# Patient Record
Sex: Female | Born: 1955 | Race: White | Hispanic: No | Smoking: Never smoker
Health system: Southern US, Community
[De-identification: ages and names within clinical notes are randomized; demographics above are authoritative.]

## PROBLEM LIST (undated history)

## (undated) DIAGNOSIS — Z9889 Other specified postprocedural states: Secondary | ICD-10-CM

## (undated) DIAGNOSIS — R112 Nausea with vomiting, unspecified: Secondary | ICD-10-CM

## (undated) DIAGNOSIS — C801 Malignant (primary) neoplasm, unspecified: Secondary | ICD-10-CM

## (undated) DIAGNOSIS — D072 Carcinoma in situ of vagina: Secondary | ICD-10-CM

## (undated) DIAGNOSIS — F419 Anxiety disorder, unspecified: Secondary | ICD-10-CM

## (undated) DIAGNOSIS — Z973 Presence of spectacles and contact lenses: Secondary | ICD-10-CM

## (undated) DIAGNOSIS — K219 Gastro-esophageal reflux disease without esophagitis: Secondary | ICD-10-CM

## (undated) DIAGNOSIS — F32A Depression, unspecified: Secondary | ICD-10-CM

## (undated) DIAGNOSIS — Z8619 Personal history of other infectious and parasitic diseases: Secondary | ICD-10-CM

## (undated) DIAGNOSIS — I10 Essential (primary) hypertension: Secondary | ICD-10-CM

## (undated) DIAGNOSIS — B977 Papillomavirus as the cause of diseases classified elsewhere: Secondary | ICD-10-CM

## (undated) DIAGNOSIS — Z87410 Personal history of cervical dysplasia: Secondary | ICD-10-CM

## (undated) DIAGNOSIS — Z87411 Personal history of vaginal dysplasia: Secondary | ICD-10-CM

## (undated) DIAGNOSIS — M1990 Unspecified osteoarthritis, unspecified site: Secondary | ICD-10-CM

## (undated) DIAGNOSIS — F329 Major depressive disorder, single episode, unspecified: Secondary | ICD-10-CM

## (undated) DIAGNOSIS — T8859XA Other complications of anesthesia, initial encounter: Secondary | ICD-10-CM

## (undated) DIAGNOSIS — T4145XA Adverse effect of unspecified anesthetic, initial encounter: Secondary | ICD-10-CM

## (undated) HISTORY — PX: ABDOMINAL HYSTERECTOMY: SHX81

## (undated) HISTORY — PX: BREAST BIOPSY: SHX20

## (undated) HISTORY — PX: CERVICAL CONE BIOPSY: SUR198

## (undated) HISTORY — DX: Depression, unspecified: F32.A

## (undated) HISTORY — DX: Major depressive disorder, single episode, unspecified: F32.9

## (undated) HISTORY — DX: Anxiety disorder, unspecified: F41.9

## (undated) HISTORY — PX: EYE SURGERY: SHX253

---

## 1982-10-15 HISTORY — PX: LAPAROSCOPIC ASSISTED VAGINAL HYSTERECTOMY: SHX5398

## 2003-10-16 HISTORY — PX: OTHER SURGICAL HISTORY: SHX169

## 2008-01-01 ENCOUNTER — Other Ambulatory Visit: Admission: RE | Admit: 2008-01-01 | Discharge: 2008-01-01 | Payer: Self-pay | Admitting: Obstetrics & Gynecology

## 2009-04-11 ENCOUNTER — Encounter: Admission: RE | Admit: 2009-04-11 | Discharge: 2009-04-11 | Payer: Self-pay | Admitting: Obstetrics & Gynecology

## 2010-04-24 ENCOUNTER — Encounter: Admission: RE | Admit: 2010-04-24 | Discharge: 2010-04-24 | Payer: Self-pay | Admitting: Obstetrics & Gynecology

## 2011-04-16 ENCOUNTER — Other Ambulatory Visit: Payer: Self-pay | Admitting: Obstetrics & Gynecology

## 2011-04-16 DIAGNOSIS — Z1231 Encounter for screening mammogram for malignant neoplasm of breast: Secondary | ICD-10-CM

## 2011-04-26 ENCOUNTER — Ambulatory Visit: Payer: Self-pay

## 2011-05-22 ENCOUNTER — Ambulatory Visit
Admission: RE | Admit: 2011-05-22 | Discharge: 2011-05-22 | Disposition: A | Discharge status: Home / Self Care | Payer: PRIVATE HEALTH INSURANCE | Source: Ambulatory Visit | Attending: Obstetrics & Gynecology | Admitting: Obstetrics & Gynecology

## 2011-05-22 DIAGNOSIS — Z1231 Encounter for screening mammogram for malignant neoplasm of breast: Secondary | ICD-10-CM

## 2012-04-22 ENCOUNTER — Other Ambulatory Visit: Payer: Self-pay | Admitting: Obstetrics & Gynecology

## 2012-04-22 DIAGNOSIS — Z1231 Encounter for screening mammogram for malignant neoplasm of breast: Secondary | ICD-10-CM

## 2012-05-27 ENCOUNTER — Ambulatory Visit: Payer: PRIVATE HEALTH INSURANCE

## 2012-06-06 ENCOUNTER — Ambulatory Visit: Payer: PRIVATE HEALTH INSURANCE

## 2012-07-07 ENCOUNTER — Ambulatory Visit
Admission: RE | Admit: 2012-07-07 | Discharge: 2012-07-07 | Disposition: A | Discharge status: Home / Self Care | Payer: BC Managed Care – PPO | Source: Ambulatory Visit | Attending: Obstetrics & Gynecology | Admitting: Obstetrics & Gynecology

## 2012-07-07 DIAGNOSIS — Z1231 Encounter for screening mammogram for malignant neoplasm of breast: Secondary | ICD-10-CM

## 2012-07-07 LAB — HM PAP SMEAR

## 2013-03-25 ENCOUNTER — Ambulatory Visit (INDEPENDENT_AMBULATORY_CARE_PROVIDER_SITE_OTHER): Payer: Self-pay | Admitting: Nurse Practitioner

## 2013-03-25 ENCOUNTER — Encounter: Payer: Self-pay | Admitting: Nurse Practitioner

## 2013-03-25 VITALS — BP 120/66 | HR 74 | Resp 14 | Ht 64.5 in | Wt 133.2 lb

## 2013-03-25 DIAGNOSIS — A630 Anogenital (venereal) warts: Secondary | ICD-10-CM

## 2013-03-25 MED ORDER — IMIQUIMOD 5 % EX CREA: TOPICAL_CREAM | CUTANEOUS | Status: DC

## 2013-03-25 NOTE — Patient Instructions (Signed)
Genital Warts Genital warts are a sexually transmitted infection. They may appear as small bumps on the tissues of the genital area. CAUSES  Genital warts are caused by a virus called human papillomavirus (HPV). HPV is the most common sexually transmitted disease (STD) and infection of the sex organs. This infection is spread by having unprotected sex with an infected person. It can be spread by vaginal, anal, and oral sex. Many people do not know they are infected. They may be infected for years without problems. However, even if they do not have problems, they can unknowingly pass the infection to their sexual partners. SYMPTOMS   Itching and irritation in the genital area.  Warts that bleed.  Painful sexual intercourse. DIAGNOSIS  Warts are usually recognized with the naked eye on the vagina, vulva, perineum, anus, and rectum. Certain tests can also diagnose genital warts, such as:  A Pap test.  A tissue sample (biopsy) exam.  Colposcopy. A magnifying tool is used to examine the vagina and cervix. The HPV cells will change color when certain solutions are used. TREATMENT  Warts can be removed by:  Applying certain chemicals, such as cantharidin or podophyllin.  Liquid nitrogen freezing (cryotherapy).  Immunotherapy with candida or trichophyton injections.  Laser treatment.  Burning with an electrified probe (electrocautery).  Interferon injections.  Surgery. PREVENTION  HPV vaccination can help prevent HPV infections that cause genital warts and that cause cancer of the cervix. It is recommended that the vaccination be given to people between the ages 9 to 26 years old. The vaccine might not work as well or might not work at all if you already have HPV. It should not be given to pregnant women. HOME CARE INSTRUCTIONS   It is important to follow your caregiver's instructions. The warts will not go away without treatment. Repeat treatments are often needed to get rid of warts.  Even after it appears that the warts are gone, the normal tissue underneath often remains infected.  Do not try to treat genital warts with medicine used to treat hand warts. This type of medicine is strong and can burn the skin in the genital area, causing more damage.  Tell your past and current sexual partner(s) that you have genital warts. They may be infected also and need treatment.  Avoid sexual contact while being treated.  Do not touch or scratch the warts. The infection may spread to other parts of your body.  Women with genital warts should have a cervical cancer check (Pap test) at least once a year. This type of cancer is slow-growing and can be cured if found early. Chances of developing cervical cancer are increased with HPV.  Inform your obstetrician about your warts in the event of pregnancy. This virus can be passed to the baby's respiratory tract. Discuss this with your caregiver.  Use a condom during sexual intercourse. Following treatment, the use of condoms will help prevent reinfection.  Ask your caregiver about using over-the-counter anti-itch creams. SEEK MEDICAL CARE IF:   Your treated skin becomes red, swollen, or painful.  You have a fever.  You feel generally ill.  You feel little lumps in and around your genital area.  You are bleeding or have painful sexual intercourse. MAKE SURE YOU:   Understand these instructions.  Will watch your condition.  Will get help right away if you are not doing well or get worse. Document Released: 09/28/2000 Document Revised: 12/24/2011 Document Reviewed: 04/09/2011 Exit[employer] Patient Information 2014 [employer].  

## 2013-03-25 NOTE — Progress Notes (Signed)
Subjective:     Patient ID: Patricia Gentry, female   DOB: 12/30/1955, 57 y.o.   MRN: [id number]  HPI 57 yo WM Fe presents with history of condyloma and had laser treatment in the past .This episode flared in September after a bad job situation that was stress induced.  They have now grown and areas are more [place] .She would like to have them removed.  Last time done  by Dr. Hyacinth Meeker - 7/ 2009.  She has no other urinary of vaginal symptoms.  Husband is without sign or symptoms.  She did use Aldara in the past and is willing to try again.  She leaves for [place] in the morning for her job.  Review of Systems  Constitutional: Negative.   Respiratory: Negative.   Cardiovascular: Negative.   Gastrointestinal: Negative.   Genitourinary: Negative.   Musculoskeletal: Negative.   Skin: Negative.   Neurological: Negative.   Psychiatric/Behavioral: Negative.        Objective:   Physical Exam  Constitutional: She is oriented to person, place, and time. She appears well-developed and well-nourished.  Abdominal: Soft. She exhibits no distension and no mass. There is no tenderness. There is no rebound and no guarding.  Genitourinary:     Condyloma are on left side with the largest size at the groin at 4-5 mm. Smaller cluster at the bottom with 3-4 lesions. No vaginal discharge and no internal vaginal lesions.  Neurological: She is alert and oriented to person, place, and time.  Psychiatric: She has a normal mood and affect. Her behavior is normal. Judgment and thought content normal.       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 Hyacinth Meeker in 3-4 weeks for recheck and discuss removal or laser at that time. Call back if symptoms worsens

## 2013-03-26 NOTE — Progress Notes (Signed)
Reviewed personally.  M. Suzanne Alin Hutchins, MD.  

## 2013-04-10 ENCOUNTER — Encounter: Payer: Self-pay | Admitting: Obstetrics & Gynecology

## 2013-04-15 ENCOUNTER — Ambulatory Visit: Payer: Self-pay | Admitting: Obstetrics & Gynecology

## 2013-04-16 ENCOUNTER — Ambulatory Visit (INDEPENDENT_AMBULATORY_CARE_PROVIDER_SITE_OTHER): Payer: Self-pay | Admitting: Obstetrics & Gynecology

## 2013-04-16 ENCOUNTER — Encounter: Payer: Self-pay | Admitting: Obstetrics & Gynecology

## 2013-04-16 VITALS — BP 138/84 | HR 64 | Resp 16 | Ht 64.14 in | Wt 127.0 lb

## 2013-04-16 DIAGNOSIS — A630 Anogenital (venereal) warts: Secondary | ICD-10-CM

## 2013-04-16 NOTE — Patient Instructions (Signed)
Vulvar Lesion Excision Post-procedure Instructions   You may take Ibuprofen, Aleve, or Tylenol for any pain or discomfort.   You may have a small amount of spotting.  You should wear a mini pad for the next few days.   You may use some topical Neosporin ointment (over the counter) if you would like.     You will need to call the office if you have redness around the biopsy site, if there is any unusual drainage, if the bleeding is heavy, or if you have any concerns.     Shower or bathe as normal   You will be notified within one week of your biopsy results or we will discuss your results at your follow-up appointment if needed.

## 2013-04-16 NOTE — Progress Notes (Signed)
Subjective:     Patient ID: Patricia Gentry, female   DOB: [birth year], 57 y.o.   MRN: [id number]  HPI 57 y.o.DWF here for discussion of treatment of vulvar condyloma.  Saw Shirlyn Goltz who suggested she might need laser ablation due to volume of condyloma.  Recently divorced.  Has a new job.  Very satisfied with life changes.  Has been on Aldara which has made them smaller but not resolved them.  She just wants treatment.    Review of Systems  All other systems reviewed and are negative.       Objective:   Physical Exam  Genitourinary:     Lymphadenopathy:       Right: No inguinal adenopathy present.       Left: No inguinal adenopathy present.   Reviewed findings with patient.  Recommended excision of 3 larger lesions and cryo ablation to smaller lesions.  Verbal consent obtained.  Risks of bleeding, infection, need for future treatment, and other options (doing nothing, OR laser treatment) discussed.   Areas for excision cleansed with Betadine x 3.  Small amount of 1% Lidocaine instilled beneath lesions.  Lesions excised with shave biopsy technique.  Silver nitrate used for adequate hemostasis.  Patchy area of smaller condyloma treated with Verruca Freeze with freeze-thaw-freeze technique.  Patient tolerated procedure well.  Dressings applied.      Assessment:     Condyloma, s/p excision and cryo today     Plan:     Patient will call in one month and give me update.  If not completely gone, will plan to treat at [facility] in September.  Additional Cryo treatment may be necessary.   No pathology sent.

## 2013-06-18 ENCOUNTER — Other Ambulatory Visit: Payer: Self-pay

## 2013-06-18 DIAGNOSIS — Z1231 Encounter for screening mammogram for malignant neoplasm of breast: Secondary | ICD-10-CM

## 2013-07-08 ENCOUNTER — Ambulatory Visit
Admission: RE | Admit: 2013-07-08 | Discharge: 2013-07-08 | Disposition: A | Discharge status: Home / Self Care | Payer: BC Managed Care – PPO | Source: Ambulatory Visit

## 2013-07-08 DIAGNOSIS — Z1231 Encounter for screening mammogram for malignant neoplasm of breast: Secondary | ICD-10-CM

## 2013-07-09 ENCOUNTER — Ambulatory Visit: Payer: Self-pay | Admitting: Nurse Practitioner

## 2013-07-13 ENCOUNTER — Encounter: Payer: Self-pay | Admitting: Obstetrics & Gynecology

## 2013-07-13 ENCOUNTER — Ambulatory Visit: Payer: Self-pay | Admitting: Obstetrics & Gynecology

## 2013-07-28 ENCOUNTER — Ambulatory Visit (INDEPENDENT_AMBULATORY_CARE_PROVIDER_SITE_OTHER): Payer: BC Managed Care – PPO | Admitting: Obstetrics & Gynecology

## 2013-07-28 ENCOUNTER — Encounter: Payer: Self-pay | Admitting: Obstetrics & Gynecology

## 2013-07-28 VITALS — BP 136/84 | HR 64 | Resp 16 | Ht 63.75 in | Wt 123.2 lb

## 2013-07-28 DIAGNOSIS — Z124 Encounter for screening for malignant neoplasm of cervix: Secondary | ICD-10-CM

## 2013-07-28 DIAGNOSIS — R6889 Other general symptoms and signs: Secondary | ICD-10-CM

## 2013-07-28 DIAGNOSIS — Z01419 Encounter for gynecological examination (general) (routine) without abnormal findings: Secondary | ICD-10-CM

## 2013-07-28 MED ORDER — ESCITALOPRAM OXALATE 10 MG PO TABS: 10.0000 mg | ORAL_TABLET | Freq: Every day | ORAL | Status: DC

## 2013-07-28 MED ORDER — ESTRADIOL 0.5 MG PO TABS: 0.5000 mg | ORAL_TABLET | Freq: Every day | ORAL | Status: DC

## 2013-07-28 NOTE — Patient Instructions (Signed)

## 2013-07-28 NOTE — Progress Notes (Addendum)
57 y.o. G0P0 SeparatedCaucasianF here for annual exam.  Work is very stressful.  Just got back from [place].  She did not like the city at all.  Brother also had 2 strokes this last month.  He is younger than her.  Has lost weight due to stress in life.  Reports being stable at 122-124 for several months.   Patient's last menstrual period was 10/15/1992.          Sexually active: yes  The current method of family planning is status post hysterectomy.    Exercising: yes  tae bo Smoker:  no  Health Maintenance: Pap:  07/07/12 WNL History of abnormal Pap:  yes MMG:  07/08/13 normal Colonoscopy:12/18/12, Dr. Samuel Bouche at [facility], 1 adenoma, repeat 5 years (report came 08/06/13 and scanned in EPIC) BMD:   2002 TDaP:  04/24/10 Screening Labs: PCP, Hb today: PCP, Urine today: PCP   reports that she has never smoked. She has never used smokeless tobacco. She reports that she drinks about 1.5 ounces of alcohol per week. She reports that she does not use illicit drugs.  Past Medical History  Diagnosis Date  . Other abnormal Papanicolaou smear of vagina and vaginal HPV 08/2004    History of VAIN II - treated with laser  . 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 Laparoscopic assisted vaginal hysterectomy  late 1990's    and BSO    Current Outpatient Prescriptions  Medication Sig Dispense Refill  . Biotin 1 MG CAPS Take by mouth.      . calcium carbonate 200 MG capsule Take 250 mg by mouth 2 (two) times daily with a meal.      . estradiol (ESTRACE) 0.5 MG tablet Take 0.5 mg by mouth daily.      . Multiple Vitamin (MULTIVITAMIN) tablet Take 1 tablet by mouth daily.      . Omega-3 Fatty Acids (FISH OIL PO) Take by mouth.      Marland Kitchen atorvastatin (LIPITOR) 10 MG tablet Take 10 mg by mouth daily.      . imiquimod (ALDARA) 5 % cream Apply topically 3 (three) times a week. Apply entire packet of cream to affected areas 3 times weekly.  Apply  at night and leave on during sleeping hours.  Wash off completely after 8 hours.  12 each  0   No current facility-administered medications for this visit.    Family History  Problem Relation Age of Onset  . Hypertension Mother   . Heart disease Mother   . Hypertension Father   . Heart disease Father   . Hypertension Brother   . Heart disease Brother   . Stroke Brother     ROS:  Pertinent items are noted in HPI.  Otherwise, a comprehensive ROS was negative.  Exam:   BP 136/84  Pulse 64  Resp 16  Ht 5' 3.75" (1.619 m)  Wt 123 lb 3.2 oz (55.883 kg)  BMI 21.32 kg/m2  LMP 10/15/1992  Weight change: -18lbs   Height: 5' 3.75" (161.9 cm)  Ht Readings from Last 3 Encounters:  07/28/13 5' 3.75" (1.619 m)  04/16/13 5' 4.14" (1.629 m)  03/25/13 5' 4.5" (1.638 m)    General appearance: alert, cooperative and appears stated age Head: Normocephalic, without obvious abnormality, atraumatic Neck: no adenopathy, supple, symmetrical, trachea midline and thyroid normal to inspection and palpation Lungs: clear to auscultation bilaterally Breasts: normal appearance, no masses or tenderness Heart:  regular rate and rhythm Abdomen: soft, non-tender; bowel sounds normal; no masses,  no organomegaly Extremities: extremities normal, atraumatic, no cyanosis or edema Skin: Skin color, texture, turgor normal. No rashes or lesions Lymph nodes: Cervical, supraclavicular, and axillary nodes normal. No abnormal inguinal nodes palpated Neurologic: Grossly normal   Pelvic: External genitalia:  no lesions              Urethra:  normal appearing urethra with no masses, tenderness or lesions              Bartholins and Skenes: normal                 Vagina: normal appearing vagina with normal color and discharge, no lesions              Cervix: absent              Pap taken: yes Bimanual Exam:  Uterus:  uterus absent              Adnexa: no mass, fullness, tenderness               Rectovaginal:  Confirms               Anus:  normal sphincter tone, no lesions  A:  Well Woman with normal exam H/O LAVH/BSO H/O VAIN 2 Stressors  P:   mammogram pap smear today Escitalopram 10mg  daily.  Rx to pharmacy. Estrace 0.5mg  daily.  #90/4RF. Release of colonoscopy. return annually or prn  An After Visit Summary was printed and given to the patient.

## 2013-07-30 LAB — IPS PAP SMEAR ONLY

## 2013-08-05 NOTE — Addendum Note (Signed)
Addended by: Jerene Bears on: 08/05/2013 09:45 AM   Modules accepted: Orders

## 2013-09-15 ENCOUNTER — Ambulatory Visit (HOSPITAL_COMMUNITY)
Admission: RE | Admit: 2013-09-15 | Discharge: 2013-09-15 | Disposition: A | Discharge status: Home / Self Care | Payer: BC Managed Care – PPO | Source: Ambulatory Visit | Attending: Physician Assistant | Admitting: Physician Assistant

## 2013-09-15 ENCOUNTER — Ambulatory Visit (INDEPENDENT_AMBULATORY_CARE_PROVIDER_SITE_OTHER): Payer: BC Managed Care – PPO | Admitting: Physician Assistant

## 2013-09-15 VITALS — BP 128/82 | HR 80 | Temp 98.0°F | Resp 17 | Ht 64.0 in | Wt 129.0 lb

## 2013-09-15 DIAGNOSIS — M79609 Pain in unspecified limb: Secondary | ICD-10-CM | POA: Insufficient documentation

## 2013-09-15 DIAGNOSIS — M79661 Pain in right lower leg: Secondary | ICD-10-CM

## 2013-09-15 DIAGNOSIS — M7989 Other specified soft tissue disorders: Secondary | ICD-10-CM

## 2013-09-15 NOTE — Progress Notes (Signed)
Subjective:    Patient ID: Patricia Gentry, female    DOB: 1956-02-24, 57 y.o.   MRN: [id number]  HPI   [name] is a very pleasant 57 yr old female here with concern for swelling and pain in her right leg.  Reports a couple weeks ago she "pinched a nerve" in her calf - had pain and limped around for about 2 days, but it resolved on its own.  Yesterday she stepped down wrong and felt an immediate pull in the right calf.  Felt a knot in the right calf.  Pain and swelling all the way down to her ankle and foot. Some numbness in the right lower extremity.  Also feels cold.  Propped the leg up all day yesterday, no relief of swelling.  Aleve this AM with no relief of pain. Pain is primarily in the medial aspect the right calf.    No personal or family history of clot.  Never smoker.  No hx arrythmia.  Younger brother with 2 strokes recently.  Youngest brother with arrhythmia - shocked twice, medication.  No recent long travel.  Takes 0.5mg  estradiol daily.   Review of Systems  Constitutional: Negative for fever and chills.  Respiratory: Negative for cough, shortness of breath and wheezing.   Cardiovascular: Negative for chest pain, palpitations and leg swelling.  Gastrointestinal: Negative.   Musculoskeletal: Positive for joint swelling (right ankle) and myalgias (RLE).  Skin: Negative for color change, pallor and wound.  Neurological: Positive for numbness (RLE).       Objective:   Physical Exam  Vitals reviewed. Constitutional: She is oriented to person, place, and time. She appears well-developed and well-nourished. No distress.  HENT:  Head: Normocephalic and atraumatic.  Eyes: Conjunctivae are normal. No scleral icterus.  Cardiovascular: Normal rate, regular rhythm and normal heart sounds.   Pulses:      Dorsalis pedis pulses are 1+ on the right side, and 1+ on the left side.       Posterior tibial pulses are 1+ on the right side, and 1+ on the left side.  Pulmonary/Chest: Effort  normal and breath sounds normal. She has no wheezes. She has no rales.  Musculoskeletal:       Right knee: Normal.       Left knee: Normal.       Right ankle: She exhibits swelling. She exhibits normal range of motion, no ecchymosis and no deformity.       Left ankle: Normal.       Right lower leg: She exhibits tenderness. She exhibits no swelling.       Left lower leg: Normal.       Right foot: She exhibits normal range of motion, no tenderness, no swelling and normal capillary refill.       Left foot: Normal.  Tenderness of right calf over great saphenous vein; minimal if any swelling; 1+ pitting edema of right ankle; no edema left ankle; +Homan's sign; no erythema, warmth; no ecchymosis; pulses 1+ bilaterally; feet/toes warm bilaterally; normal cap refill  Neurological: She is alert and oriented to person, place, and time. She has normal strength.  Skin: Skin is warm and dry.  Psychiatric: She has a normal mood and affect. Her behavior is normal.    R calf 32cm, L calf 32cm R ankle 23cm, L ankle 23cm  Well's Criteria = 2 (moderate probability)    Assessment & Plan:  Right calf pain - Plan: Lower Extremity Venous Duplex Right  Right leg  swelling - Plan: Lower Extremity Venous Duplex Right   [name] is a very pleasant 57 yr old female here with onset of right calf pain yesterday.  She recalls stepping wrong when the pain began, and thinks this is probably just a pulled muscle.  I agree that this could be MSK, but feel that we need to r/o DVT.  Will send pt to [facility] for U/S.  Addendum: Negative for DVT.  Will treat conservatively with rest, ice, elevated.  RTC 48 hours for recheck, sooner if worse.   Loleta Dicker MHS, PA-C [facility] 12/2/20149:33 PM

## 2013-09-15 NOTE — Patient Instructions (Signed)
You can go today, now for the doppler study. You can go to [facility], new north tower entrance A entrance, admitting, you can use [employer] if you can not find a parking spot, this is free

## 2013-09-15 NOTE — Progress Notes (Signed)
Right lower extremity venous duplex completed.  Right:  No evidence of DVT, superficial thrombosis, or Baker's cyst.  Left:  Negative for DVT in the common femoral vein.  

## 2014-05-20 ENCOUNTER — Telehealth: Payer: Self-pay | Admitting: Obstetrics & Gynecology

## 2014-05-20 DIAGNOSIS — L918 Other hypertrophic disorders of the skin: Secondary | ICD-10-CM

## 2014-05-20 NOTE — Telephone Encounter (Signed)
Spoke with patient. Patient would like to schedule skin tag removal at this time. Appointment schedule for August 28th at [place] with Dr.Miller. Patient agreeable to date and time. Order placed for precert.  Cc: Felipa Emory  Routing to provider for final review. Patient agreeable to disposition. Will close encounter

## 2014-05-20 NOTE — Telephone Encounter (Signed)
Patient said she has a skin tag on her thigh close to her panty line that dr Sabra Heck told her she would remove pt wants to schedule appt to have that done.

## 2014-05-24 ENCOUNTER — Telehealth: Payer: Self-pay | Admitting: Obstetrics & Gynecology

## 2014-05-24 NOTE — Telephone Encounter (Signed)
Per [employer], patient coverage has termed. Contacted patient to ask for updated insurance information. She states that she will "pay out of pocket" when she comes in for procedure 08/28. States that new coverage should have began, but it has not yet. If it begins prior to appt 08/28, she will fax it in.

## 2014-05-24 NOTE — Telephone Encounter (Signed)
Thank you :)

## 2014-06-11 ENCOUNTER — Ambulatory Visit (INDEPENDENT_AMBULATORY_CARE_PROVIDER_SITE_OTHER): Payer: PRIVATE HEALTH INSURANCE | Admitting: Obstetrics & Gynecology

## 2014-06-11 VITALS — BP 128/80 | HR 64 | Resp 16 | Ht 63.75 in | Wt 127.4 lb

## 2014-06-11 DIAGNOSIS — L919 Hypertrophic disorder of the skin, unspecified: Secondary | ICD-10-CM

## 2014-06-11 DIAGNOSIS — A630 Anogenital (venereal) warts: Secondary | ICD-10-CM

## 2014-06-11 DIAGNOSIS — L909 Atrophic disorder of skin, unspecified: Secondary | ICD-10-CM

## 2014-06-11 DIAGNOSIS — L918 Other hypertrophic disorders of the skin: Secondary | ICD-10-CM

## 2014-06-11 NOTE — Progress Notes (Addendum)
Subjective:     Patient ID: Patricia Gentry, female   DOB: August 11, 1956, 58 y.o.   MRN: [id number]  HPI Very nice 58 yo G0P0 Sep WF here for removal of what she believes are skin tags.  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  Has driven more than 409 miles a day several different times for work.  Review of Systems  All other systems reviewed and are negative.      Objective:   Physical Exam  Constitutional: She is oriented to person, place, and time. She appears well-developed and well-nourished.  Genitourinary:     Neurological: She is alert and oriented to person, place, and time.  Skin: Skin is warm and dry.  Psychiatric: She has a normal mood and affect.   Consent obtained for excision of lesions.  Sterile technique used.  Area cleansed with Betadine.  36mm of 1.0% Lidocaine used to anesthetized the skin.  Using sterile scissors and pick-ups, the lesions were lifted and excised.  Silver nitrate used for excellent hemostasis.  No pathology sent as pt has had prior genital wart lesions removed in the past.  Pt tolerated procedure well.  Dressing applied.       Assessment:     Excision of vulvar warts, largest lesion about 76mm in size    Plan:     Care instructions provided.  Pt to call if has any evidence of infection.  O/w return for routine care this fall.

## 2014-07-29 ENCOUNTER — Other Ambulatory Visit: Payer: Self-pay

## 2014-07-29 MED ORDER — ESTRADIOL 0.5 MG PO TABS: 0.5000 mg | ORAL_TABLET | Freq: Every day | ORAL | Status: DC

## 2014-07-29 MED ORDER — ESCITALOPRAM OXALATE 10 MG PO TABS: 10.0000 mg | ORAL_TABLET | Freq: Every day | ORAL | Status: DC

## 2014-07-29 NOTE — Telephone Encounter (Signed)
Incoming Fax from [facility] Last AEX: 07/28/13 Last refill:Lexapro-07/28/13 #30 X 12, Estrace- 07/28/13 #90 X 4 Current AEX:08/19/14 Last MMG: 07/08/13 Bi-Rads Neg  Please advise

## 2014-08-06 ENCOUNTER — Other Ambulatory Visit: Payer: Self-pay

## 2014-08-06 DIAGNOSIS — Z1231 Encounter for screening mammogram for malignant neoplasm of breast: Secondary | ICD-10-CM

## 2014-08-19 ENCOUNTER — Ambulatory Visit (INDEPENDENT_AMBULATORY_CARE_PROVIDER_SITE_OTHER): Payer: PRIVATE HEALTH INSURANCE | Admitting: Obstetrics & Gynecology

## 2014-08-19 ENCOUNTER — Encounter: Payer: Self-pay | Admitting: Obstetrics & Gynecology

## 2014-08-19 DIAGNOSIS — Z124 Encounter for screening for malignant neoplasm of cervix: Secondary | ICD-10-CM

## 2014-08-19 DIAGNOSIS — Z01419 Encounter for gynecological examination (general) (routine) without abnormal findings: Secondary | ICD-10-CM

## 2014-08-19 MED ORDER — ESTRADIOL 0.5 MG PO TABS: 0.5000 mg | ORAL_TABLET | Freq: Every day | ORAL | Status: DC

## 2014-08-19 MED ORDER — ESCITALOPRAM OXALATE 10 MG PO TABS: 10.0000 mg | ORAL_TABLET | Freq: Every day | ORAL | Status: DC

## 2014-08-19 NOTE — Progress Notes (Signed)
58 y.o. G0P0 Legally SeparatedCaucasianF here for annual exam.  No vaginal bleeding.  Doing well.  Dating a new person the last three to four weeks.   Patient's last menstrual period was 10/15/1992.          Sexually active: Yes.    The current method of family planning is status post hysterectomy.    Exercising: Yes.    walking Smoker:  no  Health Maintenance: Pap:  07/28/13 ASCUS/negative HR HPV History of abnormal Pap:  Yes h/o VAIN II/laser of vaginal cuff MMG:  07/08/13-normal Colonoscopy:  12/28/12-repeat in 5 years-Dr Linton Rump 1 adenoma BMD:   2002 TDaP:  04/24/10 Screening Labs: PCP, Hb today: PCP, Urine today: PCP   reports that she has never smoked. She has never used smokeless tobacco. She reports that she drinks alcohol. She reports that she does not use illicit drugs.  Past Medical History  Diagnosis Date  . Other abnormal Papanicolaou smear of vagina and vaginal HPV 08/2004    History of VAIN II - treated with laser  . 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 Anxiety   . Depression     Past Surgical History  Procedure Laterality Date  . Laparoscopic assisted vaginal hysterectomy  late 1990's    and BSO  . Abdominal hysterectomy      Current Outpatient Prescriptions  Medication Sig Dispense Refill  . Biotin 1 MG CAPS Take by mouth.    . calcium carbonate 200 MG capsule Take 250 mg by mouth 2 (two) times daily with a meal.    . escitalopram (LEXAPRO) 10 MG tablet Take 1 tablet (10 mg total) by mouth daily. 30 tablet 0  . estradiol (ESTRACE) 0.5 MG tablet Take 1 tablet (0.5 mg total) by mouth daily. 30 tablet 0  . Multiple Vitamin (MULTIVITAMIN) tablet Take 1 tablet by mouth daily.    . Omega-3 Fatty Acids (FISH OIL PO) Take by mouth.     No current facility-administered medications for this visit.    Family History  Problem Relation Age of Onset  . Hypertension Mother   . Heart disease Mother   . Hypertension Father   . Heart disease Father   . Hypertension  Brother   . Heart disease Brother   . Stroke Brother   . Diabetes Brother     younger brother    ROS:  Pertinent items are noted in HPI.  Otherwise, a comprehensive ROS was negative.  Exam:   General appearance: alert, cooperative and appears stated age Head: Normocephalic, without obvious abnormality, atraumatic Neck: no adenopathy, supple, symmetrical, trachea midline and thyroid normal to inspection and palpation Lungs: clear to auscultation bilaterally Breasts: normal appearance, no masses or tenderness Heart: regular rate and rhythm Abdomen: soft, non-tender; bowel sounds normal; no masses,  no organomegaly Extremities: extremities normal, atraumatic, no cyanosis or edema Skin: Skin color, texture, turgor normal. No rashes or lesions Lymph nodes: Cervical, supraclavicular, and axillary nodes normal. No abnormal inguinal nodes palpated Neurologic: Grossly normal   Pelvic: External genitalia:  no lesions              Urethra:  normal appearing urethra with no masses, tenderness or lesions              Bartholins and Skenes: normal                 Vagina: normal appearing vagina with normal color and discharge, no lesions  Cervix: absent              Pap taken: Yes.   Bimanual Exam:  Uterus:  uterus absent              Adnexa: no mass, fullness, tenderness               Rectovaginal: Confirms               Anus:  normal sphincter tone, no lesions  A:  Well Woman with normal exam H/O LAVH/BSO (Dr. Laverta Baltimore in [place], 1990) H/O VAIN 2 with h/o laser to vaginal cuff 11/05  P: Mammogram yearly pap smear today Escitalopram 10mg  daily. Rx to pharmacy. Estrace 0.5mg  daily. #90/4RF. return annually or prn  An After Visit Summary was printed and given to the patient.

## 2014-08-23 LAB — IPS PAP TEST WITH REFLEX TO HPV

## 2015-03-10 ENCOUNTER — Ambulatory Visit
Admission: RE | Admit: 2015-03-10 | Discharge: 2015-03-10 | Disposition: A | Discharge status: Home / Self Care | Payer: PRIVATE HEALTH INSURANCE | Source: Ambulatory Visit

## 2015-03-10 DIAGNOSIS — Z1231 Encounter for screening mammogram for malignant neoplasm of breast: Secondary | ICD-10-CM

## 2015-03-11 ENCOUNTER — Other Ambulatory Visit: Payer: Self-pay | Admitting: Obstetrics & Gynecology

## 2015-03-11 DIAGNOSIS — R928 Other abnormal and inconclusive findings on diagnostic imaging of breast: Secondary | ICD-10-CM

## 2015-03-16 ENCOUNTER — Ambulatory Visit
Admission: RE | Admit: 2015-03-16 | Discharge: 2015-03-16 | Disposition: A | Discharge status: Home / Self Care | Payer: PRIVATE HEALTH INSURANCE | Source: Ambulatory Visit | Attending: Obstetrics & Gynecology | Admitting: Obstetrics & Gynecology

## 2015-03-16 DIAGNOSIS — R928 Other abnormal and inconclusive findings on diagnostic imaging of breast: Secondary | ICD-10-CM

## 2015-08-24 ENCOUNTER — Other Ambulatory Visit: Payer: Self-pay | Admitting: *Deleted

## 2015-08-24 NOTE — Telephone Encounter (Signed)
Medication refill request: lexapro  Last AEX:  08/19/14 SM Next AEX: 09/02/15 SM Last MMG (if hormonal medication request): 03/16/15 [id number]:neg Refill authorized: 08/19/14 #[id number]. Today please advise.

## 2015-08-25 MED ORDER — ESCITALOPRAM OXALATE 10 MG PO TABS: 10.0000 mg | ORAL_TABLET | Freq: Every day | ORAL | Status: DC

## 2015-09-02 ENCOUNTER — Ambulatory Visit (INDEPENDENT_AMBULATORY_CARE_PROVIDER_SITE_OTHER): Payer: PRIVATE HEALTH INSURANCE | Admitting: Obstetrics & Gynecology

## 2015-09-02 ENCOUNTER — Encounter: Payer: Self-pay | Admitting: Obstetrics & Gynecology

## 2015-09-02 VITALS — BP 140/84 | HR 68 | Resp 16 | Ht 63.5 in | Wt 132.0 lb

## 2015-09-02 DIAGNOSIS — Z0000 Encounter for general adult medical examination without abnormal findings: Secondary | ICD-10-CM

## 2015-09-02 DIAGNOSIS — Z01419 Encounter for gynecological examination (general) (routine) without abnormal findings: Secondary | ICD-10-CM | POA: Diagnosis not present

## 2015-09-02 DIAGNOSIS — A630 Anogenital (venereal) warts: Secondary | ICD-10-CM | POA: Diagnosis not present

## 2015-09-02 DIAGNOSIS — Z79890 Hormone replacement therapy (postmenopausal): Secondary | ICD-10-CM

## 2015-09-02 DIAGNOSIS — Z124 Encounter for screening for malignant neoplasm of cervix: Secondary | ICD-10-CM | POA: Diagnosis not present

## 2015-09-02 DIAGNOSIS — N891 Moderate vaginal dysplasia: Secondary | ICD-10-CM

## 2015-09-02 LAB — CBC
HEMATOCRIT: 43.7 % (ref 36.0–46.0)
HEMOGLOBIN: 14.8 g/dL (ref 12.0–15.0)
MCH: 31.4 pg (ref 26.0–34.0)
MCHC: 33.9 g/dL (ref 30.0–36.0)
MCV: 92.6 fL (ref 78.0–100.0)
MPV: 10.5 fL (ref 8.6–12.4)
Platelets: 261 10*3/uL (ref 150–400)
RBC: 4.72 MIL/uL (ref 3.87–5.11)
RDW: 13.1 % (ref 11.5–15.5)
WBC: 5.1 10*3/uL (ref 4.0–10.5)

## 2015-09-02 LAB — COMPREHENSIVE METABOLIC PANEL
ALK PHOS: 81 U/L (ref 33–130)
ALT: 68 U/L — AB (ref 6–29)
AST: 45 U/L — AB (ref 10–35)
Albumin: 4.8 g/dL (ref 3.6–5.1)
BILIRUBIN TOTAL: 0.4 mg/dL (ref 0.2–1.2)
BUN: 12 mg/dL (ref 7–25)
CALCIUM: 9.8 mg/dL (ref 8.6–10.4)
CO2: 29 mmol/L (ref 20–31)
Chloride: 101 mmol/L (ref 98–110)
Creat: 0.6 mg/dL (ref 0.50–1.05)
GLUCOSE: 73 mg/dL (ref 65–99)
Potassium: 4.3 mmol/L (ref 3.5–5.3)
Sodium: 142 mmol/L (ref 135–146)
TOTAL PROTEIN: 7.9 g/dL (ref 6.1–8.1)

## 2015-09-02 LAB — TSH: TSH: 1.891 u[IU]/mL (ref 0.350–4.500)

## 2015-09-02 MED ORDER — ESCITALOPRAM OXALATE 10 MG PO TABS: 10.0000 mg | ORAL_TABLET | Freq: Every day | ORAL | Status: DC

## 2015-09-02 MED ORDER — ESTRADIOL 0.5 MG PO TABS: 0.5000 mg | ORAL_TABLET | Freq: Every day | ORAL | Status: DC

## 2015-09-02 NOTE — Progress Notes (Signed)
59 y.o. G0P0 DivorcedCaucasianF here for annual exam.  Doing well.  Work is stressful, as usual, for her.  Did finally finish her divorce proceedings this year.  She is very grateful that this is over.  No vaginal bleeding.  No pelvic pain.  No pain with intercourse.  No bleeding with intercourse.  PCP:  Dr. Mardi Mainland.    Patient's last menstrual period was 10/15/1992.          Sexually active: Yes.    The current method of family planning is status post hysterectomy.    Exercising: No.  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 She has never used smokeless tobacco. She reports that she does not drink alcohol or use illicit drugs.  Past Medical History  Diagnosis Date  . Other abnormal Papanicolaou smear of vagina and vaginal HPV 08/2004    History of VAIN II - treated with laser  . 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 Anxiety   . Depression     Past Surgical History  Procedure Laterality Date  . Laparoscopic assisted vaginal hysterectomy  late 1990's    and BSO  . Abdominal hysterectomy      Current Outpatient Prescriptions  Medication Sig Dispense Refill  . Biotin 1 MG CAPS Take by mouth.    . calcium carbonate 200 MG capsule Take 250 mg by mouth 2 (two) times daily with a meal.    . escitalopram (LEXAPRO) 10 MG tablet Take 1 tablet (10 mg total) by mouth daily. 30 tablet 0  . estradiol (ESTRACE) 0.5 MG tablet Take 1 tablet (0.5 mg total) by mouth daily. 90 tablet 4  . Multiple Vitamin (MULTIVITAMIN) tablet Take 1 tablet by mouth daily.    . Omega-3 Fatty Acids (FISH OIL PO) Take by mouth.     No current facility-administered  medications for this visit.    Family History  Problem Relation Age of Onset  . Hypertension Mother   . Heart disease Mother   . Hypertension Father   . Heart disease Father   . Hypertension Brother   . Heart disease Brother   . Stroke Brother   . Diabetes Brother     younger brother  . Osteoporosis Maternal Aunt     ROS:  Pertinent items are noted in HPI.  Otherwise, a comprehensive ROS was negative.  Exam:   General appearance: alert, cooperative and appears stated age Head: Normocephalic, without obvious abnormality, atraumatic Neck: no adenopathy, supple, symmetrical, trachea midline and thyroid normal to inspection and palpation Lungs: clear to auscultation bilaterally Breasts: normal appearance, no masses or tenderness Heart: regular rate and rhythm Abdomen: soft, non-tender; bowel sounds normal; no masses,  no organomegaly Extremities: extremities normal, atraumatic, no cyanosis or edema Skin: Skin color, texture, turgor normal. No rashes or lesions Lymph nodes: Cervical, supraclavicular, and axillary nodes normal. No abnormal inguinal nodes palpated Neurologic: Grossly normal  Pelvic: External genitalia:  no lesions              Urethra:  normal appearing urethra with no masses, tenderness or lesions  Bartholins and Skenes: normal                 Vagina: normal appearing vagina with normal color and discharge, area on anterior vaginal mucosa with increased erythema               Cervix: absent              Pap taken: Yes.  (specifically the area of increased erythema was especially sampled with the Pap smear) Bimanual Exam:  Uterus:  uterus absent              Adnexa: no mass, fullness, tenderness               Rectovaginal: Confirms               Anus:  normal sphincter tone, no lesions  Chaperone was present for exam.  A:  Well Woman with normal exam H/O LAVH/BSO (Dr. 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  Normal  paps since except for ASCUS pap last year with neg HR HPV last two years.  P: Mammogram yearly pap smear today Escitalopram 10mg  daily. #30/13RF Estrace 0.5mg  daily. #90/4RF.  Pt continues to desire HRT use due to hot flashes.  Aware of DVT/PE/stroke/MI/breast cancer risk. CBC, CMP, lipids, TSH, Vit D obtained today.  (Non fasting labs) return annually or prn

## 2015-09-03 LAB — VITAMIN D 25 HYDROXY (VIT D DEFICIENCY, FRACTURES): Vit D, 25-Hydroxy: 29 ng/mL — ABNORMAL LOW (ref 30–100)

## 2015-09-07 LAB — IPS PAP TEST WITH REFLEX TO HPV

## 2015-09-14 ENCOUNTER — Telehealth: Payer: Self-pay | Admitting: Obstetrics & Gynecology

## 2015-09-14 ENCOUNTER — Telehealth: Payer: Self-pay | Admitting: Emergency Medicine

## 2015-09-14 DIAGNOSIS — R87623 High grade squamous intraepithelial lesion on cytologic smear of vagina (HGSIL): Secondary | ICD-10-CM

## 2015-09-14 NOTE — Telephone Encounter (Signed)
-----   Message from [name], MD sent at 09/09/2015  7:11 AM EST ----- Inform pt pap is abnormal.  Needs colposcopy.

## 2015-09-14 NOTE — Telephone Encounter (Signed)
Patient notified of messages from Dr. Sabra Heck.  Advised of abnormal pap smear this year.  She is agreeable to scheduling colposcopy. Scheduled for 09/15/15 at 1000 with Dr. Sabra Heck  Brief description of procedure given to patient.  Colposcopy pre-procedure instructions given. Make sure to eat a meal and hydrate before appointment.  Advised 800 mg of Motrin PO with food one hour prior to appointment. Patient verbalized understanding of preprocedure instructions.   Patient is advised she will be contacted with insurance coverage information. cc Kerry Hough

## 2015-09-14 NOTE — Telephone Encounter (Signed)
-----   Message from [name], MD sent at 09/09/2015  7:12 AM EST ----- Also inform pt her Vit D is a little low.  Needs to take 1000-2000 IU daily.  TSH normal.  CBC normal.  CMP showed liver enzymes mildly elevated.  Watch tylenol and ETOH intake.  Will repeat when she returns for colposcopy.

## 2015-09-14 NOTE — Telephone Encounter (Signed)
Spoke with patient regarding benefit for colposcopy scheduled 09/15/15. Patient understands and agreeable. Reviewed arrival date/time. Patient agreeable. No further questions. Ok to close.

## 2015-09-15 ENCOUNTER — Ambulatory Visit (INDEPENDENT_AMBULATORY_CARE_PROVIDER_SITE_OTHER): Payer: PRIVATE HEALTH INSURANCE | Admitting: Obstetrics & Gynecology

## 2015-09-15 ENCOUNTER — Encounter: Payer: Self-pay | Admitting: Obstetrics & Gynecology

## 2015-09-15 DIAGNOSIS — R87623 High grade squamous intraepithelial lesion on cytologic smear of vagina (HGSIL): Secondary | ICD-10-CM

## 2015-09-15 NOTE — Progress Notes (Signed)
59 y.o. Divorced Not Hispanic or Latino female here for colposcopy with possible biopsies and/or ECC due to ASCUS pap with +HR HPV Pap obtained 09/02/2015.   Remote hx of VAIN 2 s/p laser of vagina > 10 years ago in 11/05.  Denies vaginal bleeding  Patient's last menstrual period was 10/15/1992.          Sexually active: Yes.    The current method of family planning is status post hysterectomy.     Patient has been counseled about results and procedure.  Risks and benefits have bene reviewed including immediate and/or delayed bleeding, infection, cervical scaring from procedure, possibility of needing additional follow up as well as treatment.  rare risks of missing a lesion discussed as well.  All questions answered.  Pt ready to proceed.  BP 124/76 mmHg  Pulse 80  Resp 16  Ht 5' 3.5" (1.613 m)  Wt 133 lb (60.328 kg)  BMI 23.19 kg/m2  LMP 10/15/1992  General appearance: alert, cooperative and appears stated age Head: Normocephalic, without obvious abnormality, atraumatic Neurologic: Grossly normal  Pelvic: External genitalia:  no lesions              Urethra:  normal appearing urethra with no masses, tenderness or lesions              Bartholins and Skenes: normal                 Vagina: normal appearing vagina with normal color and discharge, no lesions              Cervix: absent              Pap taken: No.  Speculum placed.  3% acetic acid applied to cervix for >45 seconds.  Cervix visualized with both 7.5X and 15X magnification.  Green filter also used.  Lugols solution was not used.  Findings:  Well demarcated lesion to left of midline.  Vascularity noted.  Biopsy:  Obtained in three locations within the lesion.  All of this tissue was put into one vial.  Monsel's was not needed.  Excellent hemostasis was present.  Entire vaginal was inspected with removal of the speculum.  No other lesions noted.  Pt tolerated procedure well and all instruments were removed.    Assessment:  ASCUS  H pap in pt with hx of VAIN2 treated with laser 11/05  Plan:  Pathology results will be called to patient and follow-up planned pending results.

## 2015-10-03 ENCOUNTER — Encounter: Payer: Self-pay | Admitting: Gynecologic Oncology

## 2015-10-03 ENCOUNTER — Ambulatory Visit: Payer: PRIVATE HEALTH INSURANCE | Attending: Gynecologic Oncology | Admitting: Gynecologic Oncology

## 2015-10-03 VITALS — BP 139/83 | HR 74 | Temp 97.8°F | Resp 18 | Ht 63.5 in | Wt 135.8 lb

## 2015-10-03 DIAGNOSIS — D072 Carcinoma in situ of vagina: Secondary | ICD-10-CM

## 2015-10-03 NOTE — Patient Instructions (Addendum)
We will contact you with the results of your biopsy from today.  If VAIN III (or vaginal dysplasia) identified, plan for a laser of the vagina on October 20, 2015 at the [facility].  You will receive a phone call from an RN at the [facility] to discuss pre-surgical instructions.

## 2015-10-03 NOTE — Progress Notes (Signed)
Consult Note: Gyn-Onc  Consult was requested by Dr. Sabra Gentry for the evaluation of Patricia Gentry 59 y.o. female  CC:  Chief Complaint  Patient presents with  . VAIN III    New consultation    Assessment/Plan:  [name]  is a 59 y.o.  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  I took comprehensive biopsies from the bed of the lesion today. If it confirms VAIN 3 only, we will proceed with vaginal laser (I discussed this procedure to the patient including its risks and anticipated recovery).  If today's biopsies confirm invasive carcinoma, due to its location on the anterior wall of the bladder I have concerns about being able to adequately resect with margins without compromising the trigone of the bladder. I would likely recommend primary vaginal brachytherapy.   HPI: Patricia Gentry is a very pleasant 59 year old G0 who is seen in consultation at the request of Dr Patricia Gentry for [place].  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 She underwent an abdominal hysterectomy in 1984 for endometriosis. Approximate 4 years following this she began developing vaginal dysplasia. This predominantly low-grade and required minimal intervention. Her last interventional procedure was a vaginal laser for high-grade lesion in 2005. Following that she had a series of ASCUS Pap's with negative high-risk HPV. However in 09/02/2015 she had an ASCUS pap with high-risk HPV identified. This prompted a colposcopic evaluation on 09/15/2015 and biopsies of the anterior vagina. Dr. Sabra Gentry notes that she sore a lesion on the anterior wall of the vaginal cuff. 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  The patient denies dyspareunia or vaginal bleeding or abnormal discharge. She is otherwise a very healthy woman who is treated only with estradiol orally and antianxiety medication.   Current Meds:  Outpatient Encounter Prescriptions  as of 10/03/2015  Medication Sig  . Biotin 1 MG CAPS Take by mouth.  . calcium carbonate 200 MG capsule Take 250 mg by mouth 2 (two) times daily with a meal.  . Cholecalciferol (VITAMIN D PO) Take 2,000 Int'l Units by mouth daily.  Marland Kitchen escitalopram (LEXAPRO) 10 MG tablet Take 1 tablet (10 mg total) by mouth daily.  Marland Kitchen estradiol (ESTRACE) 0.5 MG tablet Take 1 tablet (0.5 mg total) by mouth daily.  . Multiple Vitamin (MULTIVITAMIN) tablet Take 1 tablet by mouth daily.  . Omega-3 Fatty Acids (FISH OIL PO) Take by mouth.  . Probiotic Product (ALIGN PO) Take by mouth daily.   No facility-administered encounter medications on file as of 10/03/2015.    Allergy:  Allergies  Allergen Reactions  . Other     Band aides    Social Hx:   Social History   Social History  . Marital Status: Divorced    Spouse Name: N/A  . Number of Children: N/A  . Years of Education: N/A   Occupational History  . Not on file.   Social History Main Topics  . Smoking status: Never Smoker   . Smokeless tobacco: Never Used  . Alcohol Use: No  . Drug Use: No  . Sexual Activity:    Partners: Male    Birth Control/ Protection: Surgical     Comment: LAVH/BSO   Other Topics Concern  . Not on file   Social History Narrative    Past Surgical Hx:  Past Surgical History  Procedure Laterality Date  . Laparoscopic assisted vaginal hysterectomy  early 45's    and BSO  . Cervical cone biopsy  mid 80's  . Laser of vaginal  11/05    VAIN II    Past Medical Hx:  Past Medical History  Diagnosis Date  . Other abnormal Papanicolaou smear of vagina and vaginal HPV 08/2004    History of VAIN II - treated with laser, 2016 ASCUS cant exclude ASC-H  . 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 Anxiety   . Depression     Past Gynecological History:  G0, hysterectomy for endometriosis in 1984  Patient's last menstrual period was 10/15/1992.  Family Hx:  Family History  Problem Relation Age of Onset  .  Hypertension Mother   . Heart disease Mother   . Hypertension Father   . Heart disease Father   . Hypertension Brother   . Heart disease Brother   . Stroke Brother   . Diabetes Brother     younger brother  . Osteoporosis Maternal Aunt     Review of Systems:  Constitutional  Feels well,    ENT Normal appearing ears and nares bilaterally Skin/Breast  No rash, sores, jaundice, itching, dryness Cardiovascular  No chest pain, shortness of breath, or edema  Pulmonary  No cough or wheeze.  Gastro Intestinal  No nausea, vomitting, or diarrhoea. No bright red blood per rectum, no abdominal pain, change in bowel movement, or constipation.  Genito Urinary  No frequency, urgency, dysuria, see HPI Musculo Skeletal  No myalgia, arthralgia, joint swelling or pain  Neurologic  No weakness, numbness, change in gait,  Psychology  No depression, anxiety, insomnia.   Vitals:  Blood pressure 139/83, pulse 74, temperature 97.8 F (36.6 C), temperature source Oral, resp. rate 18, height 5' 3.5" (1.613 m), weight 135 lb 12.8 oz (61.598 kg), last menstrual period 10/15/1992, SpO2 97 %.  Physical Exam: WD in NAD Neck  Supple NROM, without any enlargements.  Lymph Node Survey No cervical supraclavicular or inguinal adenopathy Cardiovascular  Pulse normal rate, regularity and rhythm. S1 and S2 normal.  Lungs  Clear to auscultation bilateraly, without wheezes/crackles/rhonchi. Good air movement.  Skin  No rash/lesions/breakdown  Psychiatry  Alert and oriented to person, place, and time  Abdomen  Normoactive bowel sounds, abdomen soft, non-tender and thin without evidence of hernia.  Back No CVA tenderness Genito Urinary  Vulva/vagina: Normal external female genitalia.  No lesions. No discharge or bleeding.  Bladder/urethra:  No lesions or masses, well supported bladder  Vagina: There is a circular erythematous lesion measuring approximately 2 x 2 centimeters on the anterior vaginal wall  proximal to the vaginal cuff. It was comprehensively biopsied. It was palpably smooth and normal with no nodularity or masses  Cervix: surgically absent  Uterus: surgically absent.  Adnexa: no palpable masses. Rectal  Good tone, no masses no cul de sac nodularity.  Extremities  No bilateral cyanosis, clubbing or edema.  Procedure: Vaginal biopsy  The patient provided verbal consent. The Tischler forceps were used to make 4 passes on the anterior vaginal lesion. The lesion was comprehensively sampled. Hemostasis was obtained with silver nitrate.   Donaciano Eva, MD  10/03/2015, 4:25 PM

## 2015-10-06 ENCOUNTER — Telehealth: Payer: Self-pay | Admitting: Gynecologic Oncology

## 2015-10-06 NOTE — Telephone Encounter (Signed)
Informed patient of VAIN 3 and no invasive cancer on repeat comprehensive biopsies. Plan is for CO2 laser as scheduled.  Donaciano Eva, MD

## 2015-10-13 ENCOUNTER — Encounter (HOSPITAL_BASED_OUTPATIENT_CLINIC_OR_DEPARTMENT_OTHER): Payer: Self-pay | Admitting: *Deleted

## 2015-10-13 NOTE — Progress Notes (Signed)
NPO AFTER MN.  ARRIVE AT 0900.  NEEDS HG.  WILL TAKE ZANTAC AND ESTRADIAL AM DOS W/ SIPS OF WATER.

## 2015-10-20 ENCOUNTER — Encounter (HOSPITAL_BASED_OUTPATIENT_CLINIC_OR_DEPARTMENT_OTHER): Payer: Self-pay

## 2015-10-20 ENCOUNTER — Ambulatory Visit (HOSPITAL_BASED_OUTPATIENT_CLINIC_OR_DEPARTMENT_OTHER): Payer: PRIVATE HEALTH INSURANCE | Admitting: Anesthesiology

## 2015-10-20 ENCOUNTER — Encounter (HOSPITAL_BASED_OUTPATIENT_CLINIC_OR_DEPARTMENT_OTHER)
Admission: RE | Disposition: A | Discharge status: Home / Self Care | Payer: Self-pay | Source: Ambulatory Visit | Attending: Gynecologic Oncology

## 2015-10-20 ENCOUNTER — Ambulatory Visit (HOSPITAL_BASED_OUTPATIENT_CLINIC_OR_DEPARTMENT_OTHER)
Admission: RE | Admit: 2015-10-20 | Discharge: 2015-10-20 | Disposition: A | Discharge status: Home / Self Care | Payer: PRIVATE HEALTH INSURANCE | Source: Ambulatory Visit | Attending: Gynecologic Oncology | Admitting: Gynecologic Oncology

## 2015-10-20 DIAGNOSIS — D072 Carcinoma in situ of vagina: Secondary | ICD-10-CM | POA: Insufficient documentation

## 2015-10-20 DIAGNOSIS — Z79899 Other long term (current) drug therapy: Secondary | ICD-10-CM | POA: Insufficient documentation

## 2015-10-20 DIAGNOSIS — Z90710 Acquired absence of both cervix and uterus: Secondary | ICD-10-CM | POA: Insufficient documentation

## 2015-10-20 DIAGNOSIS — N891 Moderate vaginal dysplasia: Secondary | ICD-10-CM | POA: Diagnosis present

## 2015-10-20 DIAGNOSIS — K219 Gastro-esophageal reflux disease without esophagitis: Secondary | ICD-10-CM | POA: Diagnosis not present

## 2015-10-20 HISTORY — DX: Personal history of cervical dysplasia: Z87.410

## 2015-10-20 HISTORY — DX: Papillomavirus as the cause of diseases classified elsewhere: B97.7

## 2015-10-20 HISTORY — DX: Carcinoma in situ of vagina: D07.2

## 2015-10-20 HISTORY — DX: Other complications of anesthesia, initial encounter: T88.59XA

## 2015-10-20 HISTORY — DX: Adverse effect of unspecified anesthetic, initial encounter: T41.45XA

## 2015-10-20 HISTORY — DX: Gastro-esophageal reflux disease without esophagitis: K21.9

## 2015-10-20 HISTORY — DX: Personal history of other infectious and parasitic diseases: Z86.19

## 2015-10-20 HISTORY — DX: Presence of spectacles and contact lenses: Z97.3

## 2015-10-20 HISTORY — DX: Personal history of vaginal dysplasia: Z87.411

## 2015-10-20 HISTORY — PX: CO2 LASER APPLICATION: SHX5778

## 2015-10-20 LAB — POCT HEMOGLOBIN-HEMACUE: Hemoglobin: 14.7 g/dL (ref 12.0–15.0)

## 2015-10-20 SURGERY — CO2 LASER APPLICATION
Anesthesia: General

## 2015-10-20 MED ORDER — DEXAMETHASONE SODIUM PHOSPHATE 4 MG/ML IJ SOLN
INTRAMUSCULAR | Status: DC | PRN
  Administered 2015-10-20: 10 mg via INTRAVENOUS

## 2015-10-20 MED ORDER — FENTANYL CITRATE (PF) 100 MCG/2ML IJ SOLN
INTRAMUSCULAR | Status: AC
  Filled 2015-10-20: qty 4

## 2015-10-20 MED ORDER — LIDOCAINE HCL (CARDIAC) 20 MG/ML IV SOLN
INTRAVENOUS | Status: AC
  Filled 2015-10-20: qty 5

## 2015-10-20 MED ORDER — KETOROLAC TROMETHAMINE 30 MG/ML IJ SOLN
INTRAMUSCULAR | Status: AC
  Filled 2015-10-20: qty 1

## 2015-10-20 MED ORDER — FENTANYL CITRATE (PF) 100 MCG/2ML IJ SOLN
INTRAMUSCULAR | Status: DC | PRN
  Administered 2015-10-20: 50 ug via INTRAVENOUS

## 2015-10-20 MED ORDER — ACETIC ACID 5 % SOLN
Status: DC | PRN
  Administered 2015-10-20: 1 via TOPICAL

## 2015-10-20 MED ORDER — EPHEDRINE SULFATE 50 MG/ML IJ SOLN
INTRAMUSCULAR | Status: DC | PRN
  Administered 2015-10-20: 15 mg via INTRAVENOUS
  Administered 2015-10-20: 10 mg via INTRAVENOUS

## 2015-10-20 MED ORDER — ARTIFICIAL TEARS OP OINT
TOPICAL_OINTMENT | OPHTHALMIC | Status: AC
  Filled 2015-10-20: qty 3.5

## 2015-10-20 MED ORDER — PROPOFOL 10 MG/ML IV BOLUS
INTRAVENOUS | Status: AC
  Filled 2015-10-20: qty 40

## 2015-10-20 MED ORDER — MIDAZOLAM HCL 5 MG/5ML IJ SOLN
INTRAMUSCULAR | Status: DC | PRN
  Administered 2015-10-20: 2 mg via INTRAVENOUS

## 2015-10-20 MED ORDER — MIDAZOLAM HCL 2 MG/2ML IJ SOLN
INTRAMUSCULAR | Status: AC
  Filled 2015-10-20: qty 2

## 2015-10-20 MED ORDER — ONDANSETRON HCL 4 MG/2ML IJ SOLN
INTRAMUSCULAR | Status: DC | PRN
  Administered 2015-10-20: 4 mg via INTRAVENOUS

## 2015-10-20 MED ORDER — SILVER NITRATE-POT NITRATE 75-25 % EX MISC
CUTANEOUS | Status: DC | PRN
Start: 2015-10-20 — End: 2015-10-20
  Administered 2015-10-20: 1 via TOPICAL

## 2015-10-20 MED ORDER — LACTATED RINGERS IV SOLN
INTRAVENOUS | Status: DC
  Administered 2015-10-20: 10:00:00 via INTRAVENOUS
  Filled 2015-10-20: qty 1000

## 2015-10-20 MED ORDER — STERILE WATER FOR IRRIGATION IR SOLN
Status: DC | PRN
  Administered 2015-10-20: 500 mL

## 2015-10-20 MED ORDER — EPHEDRINE SULFATE 50 MG/ML IJ SOLN
INTRAMUSCULAR | Status: AC
  Filled 2015-10-20: qty 1

## 2015-10-20 MED ORDER — PROPOFOL 10 MG/ML IV BOLUS
INTRAVENOUS | Status: DC | PRN
  Administered 2015-10-20: 50 mg via INTRAVENOUS
  Administered 2015-10-20: 150 mg via INTRAVENOUS

## 2015-10-20 MED ORDER — KETOROLAC TROMETHAMINE 30 MG/ML IJ SOLN
INTRAMUSCULAR | Status: DC | PRN
  Administered 2015-10-20: 30 mg via INTRAVENOUS

## 2015-10-20 MED ORDER — LIDOCAINE HCL (CARDIAC) 20 MG/ML IV SOLN
INTRAVENOUS | Status: DC | PRN
  Administered 2015-10-20: 70 mg via INTRAVENOUS

## 2015-10-20 MED ORDER — EPHEDRINE SULFATE 50 MG/ML IJ SOLN
INTRAMUSCULAR | Status: AC
  Filled 2015-10-20: qty 2

## 2015-10-20 MED ORDER — ONDANSETRON HCL 4 MG/2ML IJ SOLN
INTRAMUSCULAR | Status: AC
  Filled 2015-10-20: qty 2

## 2015-10-20 MED ORDER — DEXAMETHASONE SODIUM PHOSPHATE 10 MG/ML IJ SOLN
INTRAMUSCULAR | Status: AC
  Filled 2015-10-20: qty 1

## 2015-10-20 MED ORDER — GLYCOPYRROLATE 0.2 MG/ML IJ SOLN
INTRAMUSCULAR | Status: AC
  Filled 2015-10-20: qty 1

## 2015-10-20 MED ORDER — ESTROGENS, CONJUGATED 0.625 MG/GM VA CREA: 1.0000 | TOPICAL_CREAM | Freq: Every day | VAGINAL | Status: DC

## 2015-10-20 MED ORDER — OXYCODONE-ACETAMINOPHEN 5-325 MG PO TABS: 1.0000 | ORAL_TABLET | ORAL | Status: DC | PRN

## 2015-10-20 SURGICAL SUPPLY — 37 items
APPLICATOR COTTON TIP 6IN STRL (MISCELLANEOUS) ×4 IMPLANT
CANISTER SUCTION 2500CC (MISCELLANEOUS) ×2 IMPLANT
CATH ROBINSON RED A/P 16FR (CATHETERS) ×2 IMPLANT
COVER BACK TABLE 60X90IN (DRAPES) ×2 IMPLANT
DEPRESSOR TONGUE BLADE STERILE (MISCELLANEOUS) ×2 IMPLANT
DRAPE LG THREE QUARTER DISP (DRAPES) ×2 IMPLANT
DRAPE UNDERBUTTOCKS STRL (DRAPE) ×2 IMPLANT
DRSG TELFA 3X8 NADH (GAUZE/BANDAGES/DRESSINGS) IMPLANT
ELECT BALL LEEP 3MM BLK (ELECTRODE) IMPLANT
ELECT REM PT RETURN 9FT ADLT (ELECTROSURGICAL)
ELECTRODE REM PT RTRN 9FT ADLT (ELECTROSURGICAL) IMPLANT
GLOVE BIO SURGEON STRL SZ 6 (GLOVE) ×2 IMPLANT
GOWN STRL REUS W/ TWL LRG LVL3 (GOWN DISPOSABLE) ×2 IMPLANT
GOWN STRL REUS W/TWL LRG LVL3 (GOWN DISPOSABLE) ×2
KIT ROOM TURNOVER WOR (KITS) ×2 IMPLANT
LEGGING LITHOTOMY PAIR STRL (DRAPES) ×2 IMPLANT
MANIFOLD NEPTUNE II (INSTRUMENTS) IMPLANT
NEEDLE HYPO 25X1 1.5 SAFETY (NEEDLE) IMPLANT
NS IRRIG 500ML POUR BTL (IV SOLUTION) IMPLANT
PACK BASIN DAY SURGERY FS (CUSTOM PROCEDURE TRAY) ×2 IMPLANT
PAD OB MATERNITY 4.3X12.25 (PERSONAL CARE ITEMS) ×2 IMPLANT
PAD PREP 24X48 CUFFED NSTRL (MISCELLANEOUS) ×2 IMPLANT
PENCIL BUTTON HOLSTER BLD 10FT (ELECTRODE) IMPLANT
SCOPETTES 8  STERILE (MISCELLANEOUS) ×2
SCOPETTES 8 STERILE (MISCELLANEOUS) ×2 IMPLANT
SUT VIC AB 2-0 SH 27 (SUTURE)
SUT VIC AB 2-0 SH 27XBRD (SUTURE) IMPLANT
SUT VIC AB 3-0 PS2 18 (SUTURE)
SUT VIC AB 3-0 PS2 18XBRD (SUTURE) IMPLANT
SYR CONTROL 10ML LL (SYRINGE) IMPLANT
TOWEL OR 17X24 6PK STRL BLUE (TOWEL DISPOSABLE) ×4 IMPLANT
TRAY DSU PREP LF (CUSTOM PROCEDURE TRAY) ×2 IMPLANT
TUBE CONNECTING 12X1/4 (SUCTIONS) ×2 IMPLANT
VACUUM HOSE 7/8X10 W/ WAND (MISCELLANEOUS) IMPLANT
VACUUM HOSE/TUBING 7/8INX6FT (MISCELLANEOUS) ×2 IMPLANT
WATER STERILE IRR 500ML POUR (IV SOLUTION) ×2 IMPLANT
YANKAUER SUCT BULB TIP NO VENT (SUCTIONS) ×2 IMPLANT

## 2015-10-20 NOTE — H&P (View-Only) (Signed)
Consult Note: Gyn-Onc  Consult was requested by Dr. Sabra Heck for the evaluation of Patricia Gentry 60 y.o. female  CC:  Chief Complaint  Patient presents with  . VAIN III    New consultation    Assessment/Plan:  [name]  is a 60 y.o.  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  I took comprehensive biopsies from the bed of the lesion today. If it confirms VAIN 3 only, we will proceed with vaginal laser (I discussed this procedure to the patient including its risks and anticipated recovery).  If today's biopsies confirm invasive carcinoma, due to its location on the anterior wall of the bladder I have concerns about being able to adequately resect with margins without compromising the trigone of the bladder. I would likely recommend primary vaginal brachytherapy.   HPI: Patricia Gentry is a very pleasant 60 year old G0 who is seen in consultation at the request of Dr Sabra Heck for [place].  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 She underwent an abdominal hysterectomy in 1984 for endometriosis. Approximate 4 years following this she began developing vaginal dysplasia. This predominantly low-grade and required minimal intervention. Her last interventional procedure was a vaginal laser for high-grade lesion in 2005. Following that she had a series of ASCUS Pap's with negative high-risk HPV. However in 09/02/2015 she had an ASCUS pap with high-risk HPV identified. This prompted a colposcopic evaluation on 09/15/2015 and biopsies of the anterior vagina. Dr. Sabra Heck notes that she sore a lesion on the anterior wall of the vaginal cuff. 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  The patient denies dyspareunia or vaginal bleeding or abnormal discharge. She is otherwise a very healthy woman who is treated only with estradiol orally and antianxiety medication.   Current Meds:  Outpatient Encounter Prescriptions  as of 10/03/2015  Medication Sig  . Biotin 1 MG CAPS Take by mouth.  . calcium carbonate 200 MG capsule Take 250 mg by mouth 2 (two) times daily with a meal.  . Cholecalciferol (VITAMIN D PO) Take 2,000 Int'l Units by mouth daily.  Marland Kitchen escitalopram (LEXAPRO) 10 MG tablet Take 1 tablet (10 mg total) by mouth daily.  Marland Kitchen estradiol (ESTRACE) 0.5 MG tablet Take 1 tablet (0.5 mg total) by mouth daily.  . Multiple Vitamin (MULTIVITAMIN) tablet Take 1 tablet by mouth daily.  . Omega-3 Fatty Acids (FISH OIL PO) Take by mouth.  . Probiotic Product (ALIGN PO) Take by mouth daily.   No facility-administered encounter medications on file as of 10/03/2015.    Allergy:  Allergies  Allergen Reactions  . Other     Band aides    Social Hx:   Social History   Social History  . Marital Status: Divorced    Spouse Name: N/A  . Number of Children: N/A  . Years of Education: N/A   Occupational History  . Not on file.   Social History Main Topics  . Smoking status: Never Smoker   . Smokeless tobacco: Never Used  . Alcohol Use: No  . Drug Use: No  . Sexual Activity:    Partners: Male    Birth Control/ Protection: Surgical     Comment: LAVH/BSO   Other Topics Concern  . Not on file   Social History Narrative    Past Surgical Hx:  Past Surgical History  Procedure Laterality Date  . Laparoscopic assisted vaginal hysterectomy  early 24's    and BSO  . Cervical cone biopsy  mid 80's  . Laser of vaginal  11/05    VAIN II    Past Medical Hx:  Past Medical History  Diagnosis Date  . Other abnormal Papanicolaou smear of vagina and vaginal HPV 08/2004    History of VAIN II - treated with laser, 2016 ASCUS cant exclude ASC-H  . 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 Anxiety   . Depression     Past Gynecological History:  G0, hysterectomy for endometriosis in 1984  Patient's last menstrual period was 10/15/1992.  Family Hx:  Family History  Problem Relation Age of Onset  .  Hypertension Mother   . Heart disease Mother   . Hypertension Father   . Heart disease Father   . Hypertension Brother   . Heart disease Brother   . Stroke Brother   . Diabetes Brother     younger brother  . Osteoporosis Maternal Aunt     Review of Systems:  Constitutional  Feels well,    ENT Normal appearing ears and nares bilaterally Skin/Breast  No rash, sores, jaundice, itching, dryness Cardiovascular  No chest pain, shortness of breath, or edema  Pulmonary  No cough or wheeze.  Gastro Intestinal  No nausea, vomitting, or diarrhoea. No bright red blood per rectum, no abdominal pain, change in bowel movement, or constipation.  Genito Urinary  No frequency, urgency, dysuria, see HPI Musculo Skeletal  No myalgia, arthralgia, joint swelling or pain  Neurologic  No weakness, numbness, change in gait,  Psychology  No depression, anxiety, insomnia.   Vitals:  Blood pressure 139/83, pulse 74, temperature 97.8 F (36.6 C), temperature source Oral, resp. rate 18, height 5' 3.5" (1.613 m), weight 135 lb 12.8 oz (61.598 kg), last menstrual period 10/15/1992, SpO2 97 %.  Physical Exam: WD in NAD Neck  Supple NROM, without any enlargements.  Lymph Node Survey No cervical supraclavicular or inguinal adenopathy Cardiovascular  Pulse normal rate, regularity and rhythm. S1 and S2 normal.  Lungs  Clear to auscultation bilateraly, without wheezes/crackles/rhonchi. Good air movement.  Skin  No rash/lesions/breakdown  Psychiatry  Alert and oriented to person, place, and time  Abdomen  Normoactive bowel sounds, abdomen soft, non-tender and thin without evidence of hernia.  Back No CVA tenderness Genito Urinary  Vulva/vagina: Normal external female genitalia.  No lesions. No discharge or bleeding.  Bladder/urethra:  No lesions or masses, well supported bladder  Vagina: There is a circular erythematous lesion measuring approximately 2 x 2 centimeters on the anterior vaginal wall  proximal to the vaginal cuff. It was comprehensively biopsied. It was palpably smooth and normal with no nodularity or masses  Cervix: surgically absent  Uterus: surgically absent.  Adnexa: no palpable masses. Rectal  Good tone, no masses no cul de sac nodularity.  Extremities  No bilateral cyanosis, clubbing or edema.  Procedure: Vaginal biopsy  The patient provided verbal consent. The Tischler forceps were used to make 4 passes on the anterior vaginal lesion. The lesion was comprehensively sampled. Hemostasis was obtained with silver nitrate.   Donaciano Eva, MD  10/03/2015, 4:25 PM

## 2015-10-20 NOTE — Op Note (Signed)
PATIENT: Patricia Gentry DATE: 10/20/15   Preop Diagnosis: VAIN 3  Postoperative Diagnosis: same  Surgery: CO2 laser vaginal apex  Surgeons:  Donaciano Eva, MD Assistant: none  Anesthesia: General   Estimated blood loss: minimal  IVF:  250ml   Urine output: 123XX123 ml   Complications: None   Pathology: none   Operative findings: 2cm circular erythematous lesion on anterior vaginal cuff  Procedure: The patient was identified in the preoperative holding area. Informed consent was signed on the chart. Patient was seen history was reviewed and exam was performed.   The patient was then taken to the operating room and placed in the supine position with SCD hose on. General anesthesia was then induced without difficulty. She was then placed in the dorsolithotomy position. The perineum was prepped with Betadine. The vagina was prepped with Betadine. The patient was then draped after the prep was dried. A Foley catheter was inserted into the bladder under sterile conditions.  Timeout was performed the patient, procedure, antibiotic, allergy, and length of procedure. A coated speculum was inserted into the vagina. 5% acetic acid solution was applied to the vagina. The vagina was inspected for areas of acetowhite changes or leukoplakia. The lesion was identified. The CO2 laser was turned on. All personal and the patient were assured to have appropriate eye wear and mask. The CO2 laser was set to 10 watts continuous. The laser was applied to ablate the vaginal apex lesion with a rim of laser applied half a cm beyond the margin of the lesion. The echar was wiped away to ensure adequate depth of laser throughout the lesion. Hemostasis was achieved with silver nitrate. instrument, suture, laparotomy, Ray-Tec, and needle counts were correct x2. The patient tolerated the procedure well and was taken recovery room in stable condition. This is Patricia Gentry dictating an operative note on Patricia Gentry.  Donaciano Eva, MD

## 2015-10-20 NOTE — Transfer of Care (Signed)
Immediate Anesthesia Transfer of Care Note  Patient: Patricia Gentry  Procedure(s) Performed: Procedure(s): CO2 LASER APPLICATION OF THE VAGINA (N/A)  Patient Location: PACU  Anesthesia Type:General  Level of Consciousness: awake, alert , oriented and patient cooperative  Airway & Oxygen Therapy: Patient Spontanous Breathing and Patient connected to nasal cannula oxygen  Post-op Assessment: Report given to RN and Post -op Vital signs reviewed and stable  Post vital signs: Reviewed and stable  Last Vitals:  Filed Vitals:   10/20/15 0909  BP: 135/87  Pulse: 72  Temp: 36.8 C  Resp: 16    Complications: No apparent anesthesia complications

## 2015-10-20 NOTE — Interval H&P Note (Signed)
History and Physical Interval Note:  10/20/2015 10:51 AM  Patricia Gentry  has presented today for surgery, with the diagnosis of VAIN 3  The various methods of treatment have been discussed with the patient and family. After consideration of risks, benefits and other options for treatment, the patient has consented to  Procedure(s): CO2 LASER APPLICATION OF THE VAGINA (N/A) as a surgical intervention .  The patient's history has been reviewed, patient examined, no change in status, stable for surgery.  I have reviewed the patient's chart and labs.  Questions were answered to the patient's satisfaction.     Donaciano Eva

## 2015-10-20 NOTE — Anesthesia Procedure Notes (Signed)
Procedure Name: LMA Insertion Date/Time: 10/20/2015 11:03 AM Performed by: Wanita Chamberlain Pre-anesthesia Checklist: Patient identified, Timeout performed, Emergency Drugs available, Suction available and Patient being monitored Patient Re-evaluated:Patient Re-evaluated prior to inductionOxygen Delivery Method: Circle system utilized Preoxygenation: Pre-oxygenation with 100% oxygen Intubation Type: IV induction Ventilation: Mask ventilation without difficulty LMA: LMA inserted LMA Size: 4.0 Number of attempts: 1 Placement Confirmation: breath sounds checked- equal and bilateral and positive ETCO2 Tube secured with: Tape Dental Injury: Teeth and Oropharynx as per pre-operative assessment

## 2015-10-20 NOTE — Anesthesia Postprocedure Evaluation (Signed)
Anesthesia Post Note  Patient: Patricia Gentry  Procedure(s) Performed: Procedure(s) (LRB): CO2 LASER APPLICATION OF THE VAGINA (N/A)  Patient location during evaluation: PACU Anesthesia Type: General Level of consciousness: awake and alert Pain management: pain level controlled Vital Signs Assessment: post-procedure vital signs reviewed and stable Respiratory status: spontaneous breathing, nonlabored ventilation, respiratory function stable and patient connected to nasal cannula oxygen Cardiovascular status: blood pressure returned to baseline and stable Postop Assessment: no signs of nausea or vomiting Anesthetic complications: no    Last Vitals:  Filed Vitals:   10/20/15 1140 10/20/15 1145  BP: 140/69 144/75  Pulse: 90 96  Temp: 36.6 C   Resp: 18 21    Last Pain: There were no vitals filed for this visit.               Imer Foxworth,JAMES TERRILL

## 2015-10-20 NOTE — Anesthesia Preprocedure Evaluation (Signed)
Anesthesia Evaluation  Patient identified by MRN, date of birth, ID band Patient awake    Reviewed: Allergy & Precautions, NPO status , Patient's Chart, lab work & pertinent test results  Airway Mallampati: II  TM Distance: >3 FB Neck ROM: Full    Dental   Pulmonary neg pulmonary ROS,    breath sounds clear to auscultation       Cardiovascular negative cardio ROS   Rhythm:Regular Rate:Normal     Neuro/Psych negative neurological ROS  negative psych ROS   GI/Hepatic negative GI ROS, Neg liver ROS, GERD  ,  Endo/Other  negative endocrine ROS  Renal/GU negative Renal ROS  negative genitourinary   Musculoskeletal negative musculoskeletal ROS (+)   Abdominal   Peds negative pediatric ROS (+)  Hematology negative hematology ROS (+)   Anesthesia Other Findings   Reproductive/Obstetrics negative OB ROS                             Anesthesia Physical Anesthesia Plan  ASA: I  Anesthesia Plan: General   Post-op Pain Management:    Induction: Intravenous  Airway Management Planned: LMA  Additional Equipment:   Intra-op Plan:   Post-operative Plan: Extubation in OR  Informed Consent: I have reviewed the patients History and Physical, chart, labs and discussed the procedure including the risks, benefits and alternatives for the proposed anesthesia with the patient or authorized representative who has indicated his/her understanding and acceptance.   Dental advisory given  Plan Discussed with: CRNA and Surgeon  Anesthesia Plan Comments:         Anesthesia Quick Evaluation

## 2015-10-20 NOTE — Discharge Instructions (Signed)
Vaginal laser, Care After  Rest as much as possible the first two weeks after discharge.  Arrange to have help from family or others with your daily activities when you go home.  If you feel tired, balance your activity with rest periods.  Increase activity gradually.  No restrictions on exercise  LEG AND FOOT CARE If your doctor has removed lymph nodes from your groin area, there may be an increase in swelling of your legs and feet. You can help prevent swelling by doing the following:  Elevate your legs while sitting or lying down.  If your caregiver has ordered special stockings, wear them according to instructions.  Avoid standing in one place for long periods of time.  Call the physical therapy department if you have any questions about swelling or treatment for swelling.  Avoid salt in your diet. It can cause fluid retention and swelling.  Do not cross your legs, especially when sitting. NUTRITION  You may resume your normal diet.  Drink 6 to 8 glasses of fluids a day.  Eat a healthy, balanced diet including portions of food from the meat (protein), milk, fruit, vegetable, and bread groups.  Your caregiver may recommend you take a multivitamin with iron. ELIMINATION  You may notice that your stream of urine is at a different angle, and may tend to spray. Using a plastic funnel may help to decrease urine spray.  If constipation occurs, drink more liquids, and add more fruits, vegetables, and bran to your diet. You may take a mild laxative, such as Milk of Magnesia, Metamucil, or a stool softener such as Colace, with permission from your caregiver. HYGIENE  You may shower and wash your hair.  Check with your caregiver about tub baths.  Do not add any bath oils or chemicals to your bath water, after you have permission to take baths.  You do not need to apply dressings, salves or lotions to the wound.  It is normal to notice a watery discharge from the vagina [place]   Take your temperature twice a day and record it, especially if you feel feverish or have chills.  Follow your caregiver's instructions about medicines, activity, and follow-up appointments after surgery.  Do not drink alcohol while taking pain medicine.  Change your dressing as advised by your caregiver.  You may take over-the-counter medicine for pain, recommended by your caregiver.  If your pain is not relieved with medicine, call your caregiver.  Do not take aspirin because it can cause bleeding.  Do not douche or use tampons (use a nonperfumed sanitary pad).  Do not have sexual intercourse until your caregiver gives you permission (typically 6 weeks postoperatively). Hugging, kissing, and playful sexual activity is fine with your caregiver's permission.  Take showers instead of baths, until your caregiver gives you permission to take baths.  You may take a mild medicine for constipation, recommended by your caregiver. Bran foods and drinking a lot of fluids will help with constipation.  Make sure your family understands everything about your operation and recovery. SEEK MEDICAL CARE IF:   You notice swelling and redness around the wound area.  You notice a foul smell coming from the wound or on the surgical dressing.  You notice the wound is separating.  You have painful or bloody urination.  You develop nausea and vomiting.  You develop diarrhea.  You develop a rash.  You have a reaction or allergy from the medicine.  You feel dizzy or light-headed.  You need stronger pain medicine. SEEK IMMEDIATE MEDICAL CARE IF:   You develop a temperature of 102 F (38.9 C) or higher.  You pass out.  You develop leg or chest pain.  You develop abdominal pain.  You develop shortness of breath.  You develop bleeding from the wound area.  You see pus in the wound area. MAKE SURE YOU:   Understand these instructions.  Will watch your  condition.  Will get help right away if you are not doing well or get worse. Document Released: 05/15/2004 Document Revised: 02/15/2014 Document Reviewed: 09/02/2009 [facility] Patient Information 2015 [place], [place]. This information is not intended to replace advice given to you by your health care provider. Make sure you discuss any questions you have with your health care provider.  Post Anesthesia Home Care Instructions  Activity: Get plenty of rest for the remainder of the day. A responsible adult should stay with you for 24 hours following the procedure.  For the next 24 hours, DO NOT: -Drive a car -Paediatric nurse -Drink alcoholic beverages -Take any medication unless instructed by your physician -Make any legal decisions or sign important papers.  Meals: Start with liquid foods such as gelatin or soup. Progress to regular foods as tolerated. Avoid greasy, spicy, heavy foods. If nausea and/or vomiting occur, drink only clear liquids until the nausea and/or vomiting subsides. Call your physician if vomiting continues.  Special Instructions/Symptoms: Your throat may feel dry or sore from the anesthesia or the breathing tube placed in your throat during surgery. If this causes discomfort, gargle with warm salt water. The discomfort should disappear within 24 hours.  If you had a scopolamine patch placed behind your ear for the management of post- operative nausea and/or vomiting:  1. The medication in the patch is effective for 72 hours, after which it should be removed.  Wrap patch in a tissue and discard in the trash. Wash hands thoroughly with soap and water. 2. You may remove the patch earlier than 72 hours if you experience unpleasant side effects which may include dry mouth, dizziness or visual disturbances. 3. Avoid touching the patch. Wash your hands with soap and water after contact with the patch.

## 2015-10-21 ENCOUNTER — Encounter (HOSPITAL_BASED_OUTPATIENT_CLINIC_OR_DEPARTMENT_OTHER): Payer: Self-pay | Admitting: Gynecologic Oncology

## 2015-11-14 ENCOUNTER — Ambulatory Visit: Payer: PRIVATE HEALTH INSURANCE | Attending: Gynecologic Oncology | Admitting: Gynecologic Oncology

## 2015-11-14 ENCOUNTER — Encounter: Payer: Self-pay | Admitting: Gynecologic Oncology

## 2015-11-14 DIAGNOSIS — K219 Gastro-esophageal reflux disease without esophagitis: Secondary | ICD-10-CM | POA: Insufficient documentation

## 2015-11-14 DIAGNOSIS — F419 Anxiety disorder, unspecified: Secondary | ICD-10-CM | POA: Insufficient documentation

## 2015-11-14 DIAGNOSIS — Z823 Family history of stroke: Secondary | ICD-10-CM | POA: Diagnosis not present

## 2015-11-14 DIAGNOSIS — Z833 Family history of diabetes mellitus: Secondary | ICD-10-CM | POA: Diagnosis not present

## 2015-11-14 DIAGNOSIS — Z87410 Personal history of cervical dysplasia: Secondary | ICD-10-CM

## 2015-11-14 DIAGNOSIS — F329 Major depressive disorder, single episode, unspecified: Secondary | ICD-10-CM | POA: Insufficient documentation

## 2015-11-14 DIAGNOSIS — A630 Anogenital (venereal) warts: Secondary | ICD-10-CM | POA: Diagnosis not present

## 2015-11-14 DIAGNOSIS — Z8249 Family history of ischemic heart disease and other diseases of the circulatory system: Secondary | ICD-10-CM | POA: Diagnosis not present

## 2015-11-14 DIAGNOSIS — D071 Carcinoma in situ of vulva: Secondary | ICD-10-CM | POA: Diagnosis not present

## 2015-11-14 DIAGNOSIS — D072 Carcinoma in situ of vagina: Secondary | ICD-10-CM

## 2015-11-14 DIAGNOSIS — Z90710 Acquired absence of both cervix and uterus: Secondary | ICD-10-CM | POA: Diagnosis not present

## 2015-11-14 NOTE — Progress Notes (Signed)
Postoperative Follow-up Note: Gyn-Onc  Consult was initially requested by Dr. Sabra Heck for the evaluation of Patricia Gentry 60 y.o. female  CC:  Chief Complaint  Patient presents with  . VIN III    Follow up MD visit    Assessment/Plan:  [name]  is a 60 y.o.  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 S/p CO2 laser of upper vagina.   Given her persistent, life long history of dysplasia, I am recommending repeat cytology of the cuff in 6 months. I do not see a role for HPV co-testing in this patient who developed severe dysplasia in the absence of hrHPV.  If her repeat pap in July is normal, she should be re-evaluated in January 2018.  If she has dysplasia on July's pap, she should have colposcopically directed biopsies and I would be happy to intervene if recurrent dysplasia is present. She may benefit from an upper vaginectomy in that scenario.   HPI: Patricia Gentry is a very pleasant 60 year old G0 who is seen in consultation at the request of Dr Sabra Heck for Patricia Gentry.  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 She underwent an abdominal hysterectomy in 1984 for endometriosis. Approximate 4 years following this she began developing vaginal dysplasia. This predominantly low-grade and required minimal intervention. Her last interventional procedure was a vaginal laser for high-grade lesion in 2005. Following that she had a series of ASCUS Pap's with negative high-risk HPV. However in 09/02/2015 she had an ASCUS pap with high-risk HPV identified. This prompted a colposcopic evaluation on 09/15/2015 and biopsies of the anterior vagina. Dr. Sabra Heck notes that she sore a lesion on the anterior wall of the vaginal cuff. 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  The patient denies dyspareunia or vaginal bleeding or abnormal discharge. She is otherwise a very healthy woman who is treated only with estradiol orally and antianxiety  medication.  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  Interval Hx:  On January 5th, 2017 she underwent a CO2 laser of the anterior vaginal cuff. The procedure was uncomplicated.  She has been using vaginal premarin. She denies significant discharge and has no pain.   Current Meds:  Outpatient Encounter Prescriptions as of 11/14/2015  Medication Sig  . Biotin 800 MCG TABS Take 1 tablet by mouth daily.  . Cholecalciferol (VITAMIN D3) 2000 units TABS Take 1 tablet by mouth daily.  Marland Kitchen conjugated estrogens (PREMARIN) vaginal cream Place 1 Applicatorful vaginally daily.  Marland Kitchen escitalopram (LEXAPRO) 10 MG tablet Take 1 tablet (10 mg total) by mouth daily. (Patient taking differently: Take 10 mg by mouth at bedtime. )  . estradiol (ESTRACE) 0.5 MG tablet Take 1 tablet (0.5 mg total) by mouth daily. (Patient taking differently: Take 0.5 mg by mouth every morning. )  . Omega-3 Fatty Acids (FISH OIL) 1200 MG CAPS Take 1 capsule by mouth daily.  . Probiotic Product (ALIGN PO) Take by mouth daily.  . ranitidine (ZANTAC) 150 MG tablet Take 150 mg by mouth as needed for heartburn.  . vitamin B-12 (CYANOCOBALAMIN) 1000 MCG tablet Take 1,000 mcg by mouth daily.  . vitamin C (ASCORBIC ACID) 500 MG tablet Take 500 mg by mouth daily.  . [DISCONTINUED] oxyCODONE-acetaminophen (PERCOCET) 5-325 MG tablet Take 1-2 tablets by mouth every 4 (four) hours as needed for severe pain.  No facility-administered encounter medications on file as of 11/14/2015.    Allergy:  No Known Allergies  Social Hx:   Social History   Social History  . Marital Status: Divorced    Spouse Name: N/A  . Number of Children: N/A  . Years of Education: N/A   Occupational History  . Not on file.   Social History Main Topics  . Smoking status: Never Smoker   . Smokeless tobacco: Never Used  . Alcohol Use: No  . Drug Use: No  .  Sexual Activity:    Partners: Male    Birth Control/ Protection: Surgical     Comment: LAVH/BSO   Other Topics Concern  . Not on file   Social History Narrative    Past Surgical Hx:  Past Surgical History  Procedure Laterality Date  . Laparoscopic assisted vaginal hysterectomy  1984    and BSO  . Cervical cone biopsy  mid  1980's  . Laser ablation vaginal dysplasia  2005    high-grade   . Co2 laser application N/A [id number]    Procedure: CO2 LASER APPLICATION OF THE VAGINA;  Surgeon: Everitt Amber, MD;  Location: [facility];  Service: Gynecology;  Laterality: N/A;    Past Medical Hx:  Past Medical History  Diagnosis Date  . Anxiety   . Depression   . History of condyloma acuminatum     vulva--  2009  . History of cervical dysplasia     CONIZATION 1980'S  . VAIN III (vaginal intraepithelial neoplasia grade III)   . History of vaginal dysplasia     VAIN 2--  LASER ABLATION 2005  . High risk HPV infection     per Pap 09-02-2015  . Complication of anesthesia     slow to wake  . GERD (gastroesophageal reflux disease)   . Wears glasses     Past Gynecological History:  G0, hysterectomy for endometriosis in 1984  Patient's last menstrual period was 10/15/1982.  Family Hx:  Family History  Problem Relation Age of Onset  . Hypertension Mother   . Heart disease Mother   . Hypertension Father   . Heart disease Father   . Hypertension Brother   . Heart disease Brother   . Stroke Brother   . Diabetes Brother     younger brother  . Osteoporosis Maternal Aunt     Review of Systems:  Constitutional  Feels well,    ENT Normal appearing ears and nares bilaterally Skin/Breast  No rash, sores, jaundice, itching, dryness Cardiovascular  No chest pain, shortness of breath, or edema  Pulmonary  No cough or wheeze.  Gastro Intestinal  No nausea, vomitting, or diarrhoea. No bright red blood per rectum, no abdominal pain, change in bowel movement, or  constipation.  Genito Urinary  No frequency, urgency, dysuria, see HPI Musculo Skeletal  No myalgia, arthralgia, joint swelling or pain  Neurologic  No weakness, numbness, change in gait,  Psychology  No depression, anxiety, insomnia.   Vitals:  Last menstrual period 10/15/1982.  Physical Exam: WD in NAD Neck  Supple NROM, without any enlargements.  Lymph Node Survey No cervical supraclavicular or inguinal adenopathy Cardiovascular  Pulse normal rate, regularity and rhythm. S1 and S2 normal.  Lungs  Clear to auscultation bilateraly, without wheezes/crackles/rhonchi. Good air movement.  Skin  No rash/lesions/breakdown  Psychiatry  Alert and oriented to person, place, and time  Abdomen  Normoactive bowel sounds, abdomen soft, non-tender and thin without evidence of hernia.  Back  No CVA tenderness Genito Urinary  Vulva/vagina: Normal external female genitalia.  No lesions. No discharge or bleeding.  Bladder/urethra:  No lesions or masses, well supported bladder  Vagina: There is a slightly ulcerated lesion at the vaginal apex anteriorally consistent with a healing laser site.  Cervix: surgically absent  Uterus: surgically absent.  Adnexa: no palpable masses. Rectal  Good tone, no masses no cul de sac nodularity.  Extremities  No bilateral cyanosis, clubbing or edema.   Donaciano Eva, MD  11/14/2015, 4:07 PM

## 2015-11-14 NOTE — Patient Instructions (Signed)
Healing well.  Plan to follow up with Dr. Sabra Heck in six months time with a pap smear and examination.  It is safe to resume intercourse at this time if you would like.  Please call for any questions or concerns.  Follow up with Dr. Denman George on an as needed basis.

## 2015-11-23 ENCOUNTER — Telehealth: Payer: Self-pay | Admitting: Emergency Medicine

## 2015-11-23 NOTE — Telephone Encounter (Signed)
-----   Message from [name], MD sent at 11/14/2015 10:09 PM EST ----- Regarding: follow up after laser treatment from Dr. Blenda Mounts, Pt was treated with laser treatment of the vagina by Dr. Denman George.  She needs a follow up Pap in six months and an AEX in one year.  Can you call pt and make the appts?  Thanks.  Patricia Gentry

## 2015-11-23 NOTE — Telephone Encounter (Signed)
Patient returned call and she is scheduled for 6 month pap smear with Dr. Sabra Heck for 05/11/16 at 0830.  Patient is scheduled for annual exam 12/25/2016. She would like to keep it as scheduled, unless Dr. Sabra Heck objects. Advised can keep as scheduled for now and if need to move appointment after 6 month pap smear we can do so.  Patient agreeable.  Routing to provider for final review. Patient agreeable to disposition. Will close encounter.

## 2015-11-23 NOTE — Telephone Encounter (Signed)
Message left to return call to [place] at [phone].   Patient needs 6 month pap appointment.  Has annual exam with Dr. Sabra Heck scheduled for 12/25/2016

## 2016-05-11 ENCOUNTER — Ambulatory Visit (INDEPENDENT_AMBULATORY_CARE_PROVIDER_SITE_OTHER): Payer: PRIVATE HEALTH INSURANCE | Admitting: Obstetrics & Gynecology

## 2016-05-11 ENCOUNTER — Encounter: Payer: Self-pay | Admitting: Obstetrics & Gynecology

## 2016-05-11 ENCOUNTER — Ambulatory Visit: Payer: PRIVATE HEALTH INSURANCE | Admitting: Obstetrics & Gynecology

## 2016-05-11 VITALS — BP 108/70 | HR 64 | Resp 14 | Ht 63.5 in | Wt 134.8 lb

## 2016-05-11 DIAGNOSIS — D072 Carcinoma in situ of vagina: Secondary | ICD-10-CM | POA: Insufficient documentation

## 2016-05-11 NOTE — Progress Notes (Signed)
GYNECOLOGY  VISIT    HPI: 60 y.o. G0P0 Divorced Caucasian female H/O VAIN 3 that was treated with laser ablation with Dr. Denman George 10/20/15.  Pt reports she has done well.  She saw Dr. Denman George for  follow up 11/14/15.  Repeat Pap only recommended.  Pt denies pain or vaginal bleeding.  Denies vaignal discharge.  Reports she has done well since the laser.  GYNECOLOGIC HISTORY: Patient's last menstrual period was 10/15/1982.  Patient Active Problem List   Diagnosis Date Noted  . VAIN II (vaginal intraepithelial neoplasia grade II) 09/02/2015  . Condyloma acuminatum of vulva 09/02/2015  . Postmenopausal HRT (hormone replacement therapy) 09/02/2015    Past Medical History:  Diagnosis Date  . Anxiety   . Complication of anesthesia    slow to wake  . Depression   . GERD (gastroesophageal reflux disease)   . High risk HPV infection    per Pap 09-02-2015  . History of cervical dysplasia    CONIZATION 1980'S  . History of condyloma acuminatum    vulva--  2009  . History of vaginal dysplasia    VAIN 2--  LASER ABLATION 2005  . VAIN III (vaginal intraepithelial neoplasia grade III)   . Wears glasses     Past Surgical History:  Procedure Laterality Date  . CERVICAL CONE BIOPSY  mid  1980's  . CO2 LASER APPLICATION N/A [id number]   Procedure: CO2 LASER APPLICATION OF THE VAGINA;  Surgeon: Everitt Amber, MD;  Location: [facility];  Service: Gynecology;  Laterality: N/A;  . LAPAROSCOPIC ASSISTED VAGINAL HYSTERECTOMY  1984   and BSO  . LASER ABLATION VAGINAL DYSPLASIA  2005   high-grade     MEDS:  Reviewed in EPIC and UTD  ALLERGIES: Review of patient's allergies indicates no known allergies.  Family History  Problem Relation Age of Onset  . Hypertension Mother   . Heart disease Mother   . Hypertension Father   . Heart disease Father   . Hypertension Brother   . Heart disease Brother   . Stroke Brother   . Diabetes Brother     younger brother  . Osteoporosis Maternal  Aunt     SH:  Divorced, non smoker   Review of Systems  All other systems reviewed and are negative.   PHYSICAL EXAMINATION:    BP 108/70 (BP Location: Right Arm, Patient Position: Sitting, Cuff Size: Normal)   Pulse 64   Resp 14   Ht 5' 3.5" (1.613 m)   Wt 134 lb 12.8 oz (61.1 kg)   LMP 10/15/1982   BMI 23.50 kg/m     General appearance: alert, cooperative and appears stated age  Pelvic: External genitalia:  no lesions              Urethra:  normal appearing urethra with no masses, tenderness or lesions              Bartholins and Skenes: normal                 Vagina: normal appearing vagina except for small erythematous lesion at midline and just to the right.  Pap of entire cuff obtained.              Cervix: absent              Bimanual Exam:  Uterus:  uterus absent              Adnexa: no mass, fullness, tenderness, specifically the cuff is  without nodularity  Chaperone was present for exam.  Assessment: H/o VAIN 3, s/p CO2 laser with Dr. Denman George 10/20/15 H/O VAIN 2, s/p CO2 laser 2005  Plan: Pap pending.  Results and recommendations will be called to the patient.

## 2016-05-17 LAB — IPS PAP SMEAR ONLY

## 2016-05-23 ENCOUNTER — Telehealth: Payer: Self-pay | Admitting: *Deleted

## 2016-05-23 DIAGNOSIS — R87620 Atypical squamous cells of undetermined significance on cytologic smear of vagina (ASC-US): Secondary | ICD-10-CM

## 2016-05-23 NOTE — Telephone Encounter (Signed)
Call to patient, left message to call back. 

## 2016-05-23 NOTE — Telephone Encounter (Signed)
-----   Message from [name], MD sent at 05/22/2016  6:16 PM EDT ----- Please let pt know pap was ascus and endometrial cells were present.  I want to do a colposcopy.  Please schedule.

## 2016-05-23 NOTE — Telephone Encounter (Signed)
Call to patient. Advised Pap smear shows abnormal cells and additional evaluation with colposcopy recommended. Brief review of procedure provided, patient stated she has had several times. Colpo scheduled for 06-01-16 at 330. Instructed to take Motrin 800 mg one hour prior with food.    Routing to provider for final review. Patient agreeable to disposition. Will close encounter.

## 2016-05-23 NOTE — Telephone Encounter (Signed)
Return call to Sally. °

## 2016-05-23 NOTE — Telephone Encounter (Signed)
patient is returning a call to [place].

## 2016-06-01 ENCOUNTER — Ambulatory Visit: Payer: PRIVATE HEALTH INSURANCE | Admitting: Obstetrics & Gynecology

## 2016-06-05 ENCOUNTER — Encounter: Payer: Self-pay | Admitting: Obstetrics & Gynecology

## 2016-06-05 ENCOUNTER — Ambulatory Visit (INDEPENDENT_AMBULATORY_CARE_PROVIDER_SITE_OTHER): Payer: PRIVATE HEALTH INSURANCE | Admitting: Obstetrics & Gynecology

## 2016-06-05 VITALS — BP 112/76 | HR 70 | Resp 16 | Ht 64.0 in | Wt 135.0 lb

## 2016-06-05 DIAGNOSIS — R87620 Atypical squamous cells of undetermined significance on cytologic smear of vagina (ASC-US): Secondary | ICD-10-CM | POA: Diagnosis not present

## 2016-06-05 DIAGNOSIS — D072 Carcinoma in situ of vagina: Secondary | ICD-10-CM

## 2016-06-05 NOTE — Progress Notes (Addendum)
60 y.o. Divorced Caucasian female here for colposcopy with possible biopsies due to ASCUS Pap obtained 05/15/15.  Pt with hx of VAIN 3 and laser treatment with Dr. Everitt Amber 10/20/15.  Denies vaginal bleeding.  Patient's last menstrual period was 10/15/1982.          Sexually active: Yes.     Patient has been counseled about results and procedure.  Risks and benefits have bene reviewed including immediate and/or delayed bleeding, infection, cervical scaring from procedure, possibility of needing additional follow up as well as treatment.  rare risks of missing a lesion discussed as well.  All questions answered.  Pt ready to proceed.  BP 112/76 (BP Location: Right Arm, Patient Position: Sitting)   Pulse 70   Resp 16   Ht 5\' 4"  (1.626 m)   Wt 135 lb (61.2 kg)   LMP 10/15/1982   BMI 23.17 kg/m   Physical Exam  Constitutional: She appears well-developed and well-nourished.  Genitourinary: Vagina normal. There is no rash, tenderness, lesion or injury on the right labia. There is no rash, tenderness, lesion or injury on the left labia.  Skin: Skin is warm and dry.  Psychiatric: She has a normal mood and affect.    Speculum placed.  3% acetic acid applied to cervix for >45 seconds.  Cervix visualized with both 7.5X and 15X magnification.  Green filter also used.  Lugols solution was used.  Findings:  Area of erythema at cuff, just to right with abnomal appearing vasculature.  Decreased staning with Lugol's noted in same location.  No other abnormal area present.  Lesion was about as big as the stick end of a Fox swab x 2.  Biopsy:  Entire lesion was biopsied.  Silver nitrate applied.  Excellent hemostasis was present.  Pt tolerated procedure well and all instruments were removed.  Findings noted above on picture of cervix.  Assessment:  ASCUS pap in pt who recently had laser ablation of VAIN 3 with Dr. Denman George  Plan:  Pathology results will be called to patient and follow-up planned pending  results.  Biopsy showed HGSIL.  Sent message to Dr. Denman George.  Her response:  Hi Vinnie Level,  I think it would be reasonable to repeat the pap in 23months, though there is a really high chance it will still show some degree of dysplasia. So, if on vaginal exam things look stable, it is also completely reasonable to limit the 3 month evaluation for visualization only, and biopsy if there is something that looks new and worrisome, and wait a full 6 months to repeat the pap (to give things some time to regress).   Reinaldo Meeker

## 2016-06-19 ENCOUNTER — Telehealth: Payer: Self-pay

## 2016-06-19 DIAGNOSIS — D072 Carcinoma in situ of vagina: Secondary | ICD-10-CM

## 2016-06-19 DIAGNOSIS — IMO0002 Reserved for concepts with insufficient information to code with codable children: Secondary | ICD-10-CM

## 2016-06-19 NOTE — Telephone Encounter (Signed)
-----   Message from [name], MD sent at 06/15/2016  7:13 PM EDT ----- Please let pt know her biopsy showed probable persistent high grade findings.  I tried to biopsy this all away so will repeat the pap smear in 3 months.  I did communicate with Dr. Denman George who is in agreement with plan.  Please schedule this in procedure room so I can do colposcopy if I want at the same time.  Do not precert.  Thanks.  08 recall for 3 months.

## 2016-06-19 NOTE — Telephone Encounter (Signed)
Spoke with patient. Advised of message and results as seen below from [place]. Patient is agreeable and verbalzies understanding. Appointment for 3 month pap recheck and possible colposcopy scheduled for 09/17/2016 at 3:30 pm with Dr.Miller.  Instructions given. Motrin 800 mg po x , one hour before appointment with food. Make sure to eat a meal before appointment and drink plenty of fluids. Order placed for precert.   [id number] Patricia Gentry  Routing to provider for final review. Patient agreeable to disposition. Will close encounter.

## 2016-09-17 ENCOUNTER — Encounter: Payer: Self-pay | Admitting: Obstetrics & Gynecology

## 2016-09-17 ENCOUNTER — Ambulatory Visit (INDEPENDENT_AMBULATORY_CARE_PROVIDER_SITE_OTHER): Payer: PRIVATE HEALTH INSURANCE | Admitting: Obstetrics & Gynecology

## 2016-09-17 VITALS — BP 112/78 | HR 72 | Resp 16 | Ht 64.0 in | Wt 139.6 lb

## 2016-09-17 DIAGNOSIS — N891 Moderate vaginal dysplasia: Secondary | ICD-10-CM

## 2016-09-17 DIAGNOSIS — N893 Dysplasia of vagina, unspecified: Secondary | ICD-10-CM | POA: Diagnosis not present

## 2016-09-17 DIAGNOSIS — IMO0002 Reserved for concepts with insufficient information to code with codable children: Secondary | ICD-10-CM

## 2016-09-17 DIAGNOSIS — D072 Carcinoma in situ of vagina: Secondary | ICD-10-CM | POA: Diagnosis not present

## 2016-09-17 NOTE — Progress Notes (Signed)
Patient ID: Patricia Gentry, female   DOB: 01-23-1956, 60 y.o.   MRN: [id number]  GYNECOLOGY  VISIT   HPI: 60 y.o. G0P0 Divorced Caucasian female with h/o vaginal dysplasia (VAIN 3) that was treated with C02 laser of the vagina with Dr. Denman George in January, 2017.  She was seen in July for follow up and had an ASCUS pap smear.  Colposcopy showed a tiny area of abnormality in the same place as prior findings on colposcopy.  This showed high grade dysplasia again.  However, the area was so small that (after communication with Dr. Denman George), I recommended repeat pap and possible colposcopy in 3 months.  Pt is here for this today.  She denies any vaginal bleeding or pelvic pain.    GYNECOLOGIC HISTORY: Patient's last menstrual period was 10/15/1982. Contraception:hysterectomy  Patient Active Problem List   Diagnosis Date Noted  . VAIN III (vaginal intraepithelial neoplasia III) 05/11/2016  . VAIN II (vaginal intraepithelial neoplasia grade II) 09/02/2015  . Condyloma acuminatum of vulva 09/02/2015  . Postmenopausal HRT (hormone replacement therapy) 09/02/2015    Past Medical History:  Diagnosis Date  . Anxiety   . Complication of anesthesia    slow to wake  . Depression   . GERD (gastroesophageal reflux disease)   . High risk HPV infection    per Pap 09-02-2015  . History of cervical dysplasia    CONIZATION 1980'S  . History of condyloma acuminatum    vulva--  2009  . History of vaginal dysplasia    VAIN 2--  LASER ABLATION 2005  . VAIN III (vaginal intraepithelial neoplasia grade III)   . Wears glasses     Past Surgical History:  Procedure Laterality Date  . CERVICAL CONE BIOPSY  mid  1980's  . CO2 LASER APPLICATION N/A [id number]   Procedure: CO2 LASER APPLICATION OF THE VAGINA;  Surgeon: Everitt Amber, MD;  Location: [facility];  Service: Gynecology;  Laterality: N/A;  . LAPAROSCOPIC ASSISTED VAGINAL HYSTERECTOMY  1984   and BSO  . LASER ABLATION VAGINAL DYSPLASIA  2005    high-grade     MEDS:  Reviewed in EPIC and UTD  ALLERGIES: Patient has no known allergies.  Family History  Problem Relation Age of Onset  . Hypertension Mother   . Heart disease Mother   . Hypertension Father   . Heart disease Father   . Hypertension Brother   . Heart disease Brother   . Stroke Brother   . Diabetes Brother     younger brother  . Osteoporosis Maternal Aunt     SH:  Divorced, non smoker  Review of Systems  All other systems reviewed and are negative.   PHYSICAL EXAMINATION:    BP 112/78 (BP Location: Right Arm, Patient Position: Sitting, Cuff Size: Normal)   Pulse 72   Resp 16   Ht 5\' 4"  (1.626 m)   Wt 139 lb 9.6 oz (63.3 kg)   LMP 10/15/1982   BMI 23.96 kg/m     General appearance: alert, cooperative and appears stated age  Pelvic: External genitalia:  no lesions              Urethra:  normal appearing urethra with no masses, tenderness or lesions              Bartholins and Skenes: normal                 Vagina: normal appearing vagina with normal color and discharge, erythematous lesion  at same prior location of dysplasia noted again.  This was inspected with the colposcope and does have vascular appearing punctuations.  Biopsy was obtained.                Cervix: absent              Bimanual Exam:  Uterus:  uterus absent              Adnexa: no mass, fullness, tenderness  Chaperone was present for exam.  Assessment: H/O vaginal dysplasia with treatment with CO2 laser 1/17.  Visible lesion biopsied again today  Plan: Pap and biopsy pending.  Results will be called to pt and I will likely refer her back to Dr. Denman George.

## 2016-09-19 ENCOUNTER — Encounter: Payer: Self-pay | Admitting: Obstetrics & Gynecology

## 2016-09-20 LAB — PAP IG (IMAGE GUIDED)

## 2016-09-20 LAB — PATHOLOGY

## 2016-10-01 ENCOUNTER — Telehealth: Payer: Self-pay | Admitting: *Deleted

## 2016-10-01 NOTE — Telephone Encounter (Signed)
Patient notified of pathology report verbalized understanding. Patient aware Dr. Leone Brand office will repeat the colposcopy. Patient asking when that will be. RN advised will review with Dr. Sabra Heck and return call to patient. Patient agreeable.   Routing to provider for review.

## 2016-10-01 NOTE — Telephone Encounter (Signed)
Forwarding to [place].  I spoke with Dr. Skeet Latch about her last week.  Dr. Skeet Latch thinks gyn/onc should repeat her colposcopy.  Can you call and get this scheduled?  CC:  Karmen Bongo

## 2016-10-02 NOTE — Telephone Encounter (Signed)
Appointment with Dr Denman George scheduled for November 02, 2016 at noon. Patient notified of appointment date and time and is agreeable.     06 recall is entered for 03/2017.   Encounter closed.

## 2016-10-10 ENCOUNTER — Other Ambulatory Visit: Payer: Self-pay

## 2016-10-10 ENCOUNTER — Other Ambulatory Visit: Payer: Self-pay | Admitting: Obstetrics & Gynecology

## 2016-10-10 NOTE — Telephone Encounter (Signed)
Medication refill request: Estrace 0.5mg  Last AEX:  09/02/15 SM Next AEX: 12/25/16 Last MMG (if hormonal medication request): 03/16/15 BIRADS 1 negative Refill authorized: 09/12/15 #90 w/4 refills; today please advise

## 2016-10-11 MED ORDER — ESTRADIOL 0.5 MG PO TABS: 0.5000 mg | ORAL_TABLET | Freq: Every day | ORAL | 4 refills | Status: DC

## 2016-10-11 NOTE — Telephone Encounter (Signed)
Medication refill request: Estradiol Last AEX:  09/02/15 SM Next AEX: 12/25/16 SM Last MMG (if hormonal medication request): 03/16/15 BIRADS1, Density B, [facility] Refill authorized: 09/02/15 #90 4R. Please advise. Thank you.

## 2016-10-12 ENCOUNTER — Other Ambulatory Visit: Payer: Self-pay | Admitting: Obstetrics & Gynecology

## 2016-10-16 ENCOUNTER — Other Ambulatory Visit: Payer: Self-pay | Admitting: Obstetrics & Gynecology

## 2016-10-17 NOTE — Telephone Encounter (Signed)
Medication refill request: Escitalopram Last AEX:  09/02/15 SM Next AEX: 12/25/16 SM Last MMG (if hormonal medication request): 03/16/15 [facility] Refill authorized: 09/02/15 #30 13R. Please advise. Thank you.

## 2016-11-01 ENCOUNTER — Telehealth: Payer: Self-pay | Admitting: *Deleted

## 2016-11-01 NOTE — Telephone Encounter (Signed)
"  I missed a call yesterday.  Who called and what was the call in reference to?" ROBO appointment reminder call.  Reviewed tomorrow's appointment information at this time.

## 2016-11-02 ENCOUNTER — Ambulatory Visit: Payer: PRIVATE HEALTH INSURANCE | Attending: Gynecologic Oncology | Admitting: Gynecologic Oncology

## 2016-11-02 ENCOUNTER — Encounter: Payer: Self-pay | Admitting: Gynecologic Oncology

## 2016-11-02 VITALS — BP 131/80 | HR 64 | Temp 97.8°F | Resp 18 | Ht 64.0 in | Wt 137.0 lb

## 2016-11-02 DIAGNOSIS — C52 Malignant neoplasm of vagina: Secondary | ICD-10-CM | POA: Insufficient documentation

## 2016-11-02 DIAGNOSIS — R87620 Atypical squamous cells of undetermined significance on cytologic smear of vagina (ASC-US): Secondary | ICD-10-CM | POA: Diagnosis not present

## 2016-11-02 DIAGNOSIS — R87811 Vaginal high risk human papillomavirus (HPV) DNA test positive: Secondary | ICD-10-CM

## 2016-11-02 DIAGNOSIS — Z833 Family history of diabetes mellitus: Secondary | ICD-10-CM | POA: Diagnosis not present

## 2016-11-02 DIAGNOSIS — Z79899 Other long term (current) drug therapy: Secondary | ICD-10-CM | POA: Insufficient documentation

## 2016-11-02 DIAGNOSIS — F419 Anxiety disorder, unspecified: Secondary | ICD-10-CM | POA: Insufficient documentation

## 2016-11-02 DIAGNOSIS — K219 Gastro-esophageal reflux disease without esophagitis: Secondary | ICD-10-CM | POA: Diagnosis not present

## 2016-11-02 DIAGNOSIS — Z823 Family history of stroke: Secondary | ICD-10-CM | POA: Diagnosis not present

## 2016-11-02 DIAGNOSIS — D072 Carcinoma in situ of vagina: Secondary | ICD-10-CM

## 2016-11-02 DIAGNOSIS — Z8742 Personal history of other diseases of the female genital tract: Secondary | ICD-10-CM | POA: Diagnosis not present

## 2016-11-02 DIAGNOSIS — F329 Major depressive disorder, single episode, unspecified: Secondary | ICD-10-CM | POA: Insufficient documentation

## 2016-11-02 DIAGNOSIS — Z8249 Family history of ischemic heart disease and other diseases of the circulatory system: Secondary | ICD-10-CM | POA: Insufficient documentation

## 2016-11-02 DIAGNOSIS — Z90710 Acquired absence of both cervix and uterus: Secondary | ICD-10-CM | POA: Diagnosis not present

## 2016-11-02 NOTE — Patient Instructions (Signed)
Our office will contact you with your biopsy results.

## 2016-11-02 NOTE — Progress Notes (Signed)
Postoperative Follow-up Note: Gyn-Onc  Consult was initially requested by Dr. Sabra Heck for the evaluation of Patricia Gentry 61 y.o. female  CC:  Chief Complaint  Patient presents with  . VAIN III    Assessment/Plan:  [name]  is a 61 y.o.  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 S/p CO2 laser of upper vagina.   Persistent ASCUS paps.  Will follow-up today's biopsies: if inflammation present, this would explain ASCUS paps. No treatment recommended. If dsyplasia present, recommend repeat laser.  HPI: Patricia Gentry is a very pleasant 61 year old G0 who is seen in consultation at the request of Dr Sabra Heck for [place].  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 She underwent an abdominal hysterectomy in 1984 for endometriosis. Approximate 4 years following this she began developing vaginal dysplasia. This predominantly low-grade and required minimal intervention. Her last interventional procedure was a vaginal laser for high-grade lesion in 2005. Following that she had a series of ASCUS Pap's with negative high-risk HPV. However in 09/02/2015 she had an ASCUS pap with high-risk HPV identified. This prompted a colposcopic evaluation on 09/15/2015 and biopsies of the anterior vagina. Dr. Sabra Heck notes that she sore a lesion on the anterior wall of the vaginal cuff. 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  The patient denies dyspareunia or vaginal bleeding or abnormal discharge. She is otherwise a very healthy woman who is treated only with estradiol orally and antianxiety medication.  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  Interval Hx:  On January 5th, 2017 she underwent a CO2 laser of the anterior vaginal cuff. The procedure was uncomplicated.  Pap in July 2017 showed ASCUS.  Colpo and directed biopsies of a slightly  erythematous area at the cuff showed "fragments of reactive inflamed squamous epithelium, negative for dysplasia or malignancy" on 09/20/16.  She denies vaginal bleeding or pain.   Current Meds:  Outpatient Encounter Prescriptions as of 11/02/2016  Medication Sig  . atorvastatin (LIPITOR) 20 MG tablet   . Biotin 800 MCG TABS Take 1 tablet by mouth daily.  . Cholecalciferol (VITAMIN D3) 2000 units TABS Take 1 tablet by mouth daily.  Marland Kitchen escitalopram (LEXAPRO) 10 MG tablet TAKE ONE (1) TABLET EACH DAY  . estradiol (ESTRACE) 0.5 MG tablet Take 1 tablet (0.5 mg total) by mouth daily.  . Omega-3 Fatty Acids (FISH OIL) 1200 MG CAPS Take 1 capsule by mouth daily.  . Probiotic Product (ALIGN PO) Take by mouth daily.  . ranitidine (ZANTAC) 150 MG tablet Take 150 mg by mouth as needed for heartburn.  . vitamin B-12 (CYANOCOBALAMIN) 1000 MCG tablet Take 1,000 mcg by mouth daily.  . vitamin C (ASCORBIC ACID) 500 MG tablet Take 500 mg by mouth daily.   No facility-administered encounter medications on file as of 11/02/2016.     Allergy:  No Known Allergies  Social Hx:   Social History   Social History  . Marital status: Divorced    Spouse name: N/A  . Number of children: N/A  . Years of education: N/A   Occupational History  . Not on file.   Social History Main Topics  . Smoking status: Never Smoker  . Smokeless tobacco: Never Used  . Alcohol use No  . Drug use: No  . Sexual activity: Yes    Partners: Male  Birth control/ protection: Surgical     Comment: LAVH/BSO   Other Topics Concern  . Not on file   Social History Narrative  . No narrative on file    Past Surgical Hx:  Past Surgical History:  Procedure Laterality Date  . CERVICAL CONE BIOPSY  mid  1980's  . CO2 LASER APPLICATION N/A [id number]   Procedure: CO2 LASER APPLICATION OF THE VAGINA;  Surgeon: Everitt Amber, MD;  Location: [facility];  Service: Gynecology;  Laterality: N/A;  . LAPAROSCOPIC ASSISTED  VAGINAL HYSTERECTOMY  1984   and BSO  . LASER ABLATION VAGINAL DYSPLASIA  2005   high-grade     Past Medical Hx:  Past Medical History:  Diagnosis Date  . Anxiety   . Complication of anesthesia    slow to wake  . Depression   . GERD (gastroesophageal reflux disease)   . High risk HPV infection    per Pap 09-02-2015  . History of cervical dysplasia    CONIZATION 1980'S  . History of condyloma acuminatum    vulva--  2009  . History of vaginal dysplasia    VAIN 2--  LASER ABLATION 2005  . VAIN III (vaginal intraepithelial neoplasia grade III)   . Wears glasses     Past Gynecological History:  G0, hysterectomy for endometriosis in 1984  Patient's last menstrual period was 10/15/1982.  Family Hx:  Family History  Problem Relation Age of Onset  . Hypertension Mother   . Heart disease Mother   . Hypertension Father   . Heart disease Father   . Hypertension Brother   . Heart disease Brother   . Stroke Brother   . Diabetes Brother     younger brother  . Osteoporosis Maternal Aunt     Review of Systems:  Constitutional  Feels well,    ENT Normal appearing ears and nares bilaterally Skin/Breast  No rash, sores, jaundice, itching, dryness Cardiovascular  No chest pain, shortness of breath, or edema  Pulmonary  No cough or wheeze.  Gastro Intestinal  No nausea, vomitting, or diarrhoea. No bright red blood per rectum, no abdominal pain, change in bowel movement, or constipation.  Genito Urinary  No frequency, urgency, dysuria, see HPI Musculo Skeletal  No myalgia, arthralgia, joint swelling or pain  Neurologic  No weakness, numbness, change in gait,  Psychology  No depression, anxiety, insomnia.   Vitals:  Blood pressure 131/80, pulse 64, temperature 97.8 F (36.6 C), resp. rate 18, height 5\' 4"  (1.626 m), weight 137 lb (62.1 kg), last menstrual period 10/15/1982, SpO2 99 %.  Physical Exam: WD in NAD Neck  Supple NROM, without any enlargements.  Lymph Node  Survey No cervical supraclavicular or inguinal adenopathy Cardiovascular  Pulse normal rate, regularity and rhythm. S1 and S2 normal.  Lungs  Clear to auscultation bilateraly, without wheezes/crackles/rhonchi. Good air movement.  Skin  No rash/lesions/breakdown  Psychiatry  Alert and oriented to person, place, and time  Abdomen  Normoactive bowel sounds, abdomen soft, non-tender and thin without evidence of hernia.  Back No CVA tenderness Genito Urinary  Vulva/vagina: Normal external female genitalia.  No lesions. No discharge or bleeding.  Bladder/urethra:  No lesions or masses, well supported bladder  Vagina: colposcopy and biopsies (see below)  Cervix: surgically absent  Uterus: surgically absent.  Adnexa: no palpable masses. Rectal  Good tone, no masses no cul de sac nodularity.  Extremities  No bilateral cyanosis, clubbing or edema.   PROCEDURE NOTE:  Procedure: vaginal colposcopy (entire)  with directed biopsies. Verbal consent obtained. Time out performed. Pap obtained. Colposcope used with application of 4% acetic acid to entire vagina. Area of hypervascularity identifed at anterior vaginal cuff and immediately posterior to cuff. Each <1cm in size.  Biopsy forceps used to excise the areas.  Silver nitrate applied. Hemostasis confirmed. EBL:: minimal. Patient tolerated procedure well. Specimens: posterior vaginal cuff, anterior vaginal cuff.    Donaciano Eva, MD  11/02/2016, 12:51 PM

## 2016-11-07 ENCOUNTER — Telehealth: Payer: Self-pay | Admitting: Gynecologic Oncology

## 2016-11-07 DIAGNOSIS — C52 Malignant neoplasm of vagina: Secondary | ICD-10-CM

## 2016-11-07 LAB — CYTOLOGY - PAP
DIAGNOSIS: NEGATIVE
HPV: NOT DETECTED

## 2016-11-07 NOTE — Telephone Encounter (Signed)
Left message to call back.  Patient has invasive vaginal cancer.  Recommend upper vaginectomy.  First will need PET scan.  Will order PET and return visit to see me.  Donaciano Eva, MD'

## 2016-11-08 ENCOUNTER — Telehealth: Payer: Self-pay | Admitting: Gynecologic Oncology

## 2016-11-08 NOTE — Telephone Encounter (Signed)
Patient returned call to the office.  Discussed the vaginal biopsy findings and Dr. Serita Grit recommendations to proceed with a PET scan then an appt in the office to discuss surgery if no distant disease.  Verbalizing understanding.  Advised she would be contacted with dates and times for her PET scan and office visit.  Returned call to patient with PET scan date and time and appt with Denman George.  Verbalizing understanding.  Advised to call for any needs or concerns.

## 2016-11-19 ENCOUNTER — Encounter (HOSPITAL_COMMUNITY)
Admission: RE | Admit: 2016-11-19 | Discharge: 2016-11-19 | Disposition: A | Discharge status: Home / Self Care | Payer: PRIVATE HEALTH INSURANCE | Source: Ambulatory Visit | Attending: Gynecologic Oncology | Admitting: Gynecologic Oncology

## 2016-11-19 ENCOUNTER — Encounter: Payer: Self-pay | Admitting: Gynecologic Oncology

## 2016-11-19 ENCOUNTER — Ambulatory Visit (HOSPITAL_BASED_OUTPATIENT_CLINIC_OR_DEPARTMENT_OTHER): Payer: PRIVATE HEALTH INSURANCE | Admitting: Gynecologic Oncology

## 2016-11-19 VITALS — BP 126/77 | HR 91 | Temp 98.3°F | Resp 18 | Ht 64.0 in | Wt 137.7 lb

## 2016-11-19 DIAGNOSIS — Z79899 Other long term (current) drug therapy: Secondary | ICD-10-CM | POA: Insufficient documentation

## 2016-11-19 DIAGNOSIS — Z833 Family history of diabetes mellitus: Secondary | ICD-10-CM | POA: Insufficient documentation

## 2016-11-19 DIAGNOSIS — Z8249 Family history of ischemic heart disease and other diseases of the circulatory system: Secondary | ICD-10-CM

## 2016-11-19 DIAGNOSIS — F329 Major depressive disorder, single episode, unspecified: Secondary | ICD-10-CM | POA: Insufficient documentation

## 2016-11-19 DIAGNOSIS — K219 Gastro-esophageal reflux disease without esophagitis: Secondary | ICD-10-CM | POA: Insufficient documentation

## 2016-11-19 DIAGNOSIS — F419 Anxiety disorder, unspecified: Secondary | ICD-10-CM

## 2016-11-19 DIAGNOSIS — Z8742 Personal history of other diseases of the female genital tract: Secondary | ICD-10-CM | POA: Insufficient documentation

## 2016-11-19 DIAGNOSIS — C52 Malignant neoplasm of vagina: Secondary | ICD-10-CM

## 2016-11-19 DIAGNOSIS — Z90710 Acquired absence of both cervix and uterus: Secondary | ICD-10-CM | POA: Insufficient documentation

## 2016-11-19 LAB — GLUCOSE, CAPILLARY: Glucose-Capillary: 92 mg/dL (ref 65–99)

## 2016-11-19 MED ORDER — FLUDEOXYGLUCOSE F - 18 (FDG) INJECTION: 6.7000 | Freq: Once | INTRAVENOUS | Status: DC | PRN

## 2016-11-19 NOTE — Patient Instructions (Addendum)
Preparing for your Surgery  Plan for surgery on December 04, 2016 with Dr. Everitt Amber at [place] will be scheduled for a robotic upper vaginectomy.  Expect one night stay in the hospital.  Pre-operative Testing -You will receive a phone call from presurgical testing at [facility] to arrange for a pre-operative testing appointment before your surgery.  This appointment normally occurs one to two weeks before your scheduled surgery.   -Bring your insurance card, copy of an advanced directive if applicable, medication list  -At that visit, you will be asked to sign a consent for a possible blood transfusion in case a transfusion becomes necessary during surgery.  The need for a blood transfusion is rare but having consent is a necessary part of your care.     -You should not be taking blood thinners or aspirin at least ten days prior to surgery unless instructed by your surgeon.  Day Before Surgery at [place] will be asked to take in a light diet the day before surgery.  Avoid carbonated beverages.  You will be advised to have nothing to eat or drink after midnight the evening before.     Eat a light diet the day before surgery.  Examples including soups, broths, toast, yogurt, mashed potatoes.  Things to avoid include carbonated beverages (fizzy beverages), raw fruits and raw vegetables, or beans.    If your bowels are filled with gas, your surgeon will have difficulty visualizing your pelvic organs which increases your surgical risks.  YOU WILL NEED TO DRINK TWO BOTTLES OF MAGNESIUM CITRATE ON THE DAY BEFORE SURGERY STARTING AT 4PM.  Your role in recovery Your role is to become active as soon as directed by your doctor, while still giving yourself time to heal.  Rest when you feel tired. You will be asked to do the following in order to speed your recovery:  - Cough and breathe deeply. This helps toclear and expand your lungs and can prevent  pneumonia. You may be given a spirometer to practice deep breathing. A staff member will show you how to use the spirometer. - Do mild physical activity. Walking or moving your legs help your circulation and body functions return to normal. A staff member will help you when you try to walk and will provide you with simple exercises. Do not try to get up or walk alone the first time. - Actively manage your pain. Managing your pain lets you move in comfort. We will ask you to rate your pain on a scale of zero to 10. It is your responsibility to tell your doctor or nurse where and how much you hurt so your pain can be treated.  Special Considerations -If you are diabetic, you may be placed on insulin after surgery to have closer control over your blood sugars to promote healing and recovery.  This does not mean that you will be discharged on insulin.  If applicable, your oral antidiabetics will be resumed when you are tolerating a solid diet.  -Your final pathology results from surgery should be available by the Friday after surgery and the results will be relayed to you when available.   Blood Transfusion Information WHAT IS A BLOOD TRANSFUSION? A transfusion is the replacement of blood or some of its parts. Blood is made up of multiple cells which provide different functions.  Red blood cells carry oxygen and are used for blood loss replacement.  White blood cells fight against infection.  Platelets control bleeding.  Plasma helps clot blood.  Other blood products are available for specialized needs, such as hemophilia or other clotting disorders. BEFORE THE TRANSFUSION  Who gives blood for transfusions?   You may be able to donate blood to be used at a later date on yourself (autologous donation).  Relatives can be asked to donate blood. This is generally not any safer than if you have received blood from a stranger. The same precautions are taken to ensure safety when a relative's blood  is donated.  Healthy volunteers who are fully evaluated to make sure their blood is safe. This is blood bank blood. Transfusion therapy is the safest it has ever been in the practice of medicine. Before blood is taken from a donor, a complete history is taken to make sure that person has no history of diseases nor engages in risky social behavior (examples are intravenous drug use or sexual activity with multiple partners). The donor's travel history is screened to minimize risk of transmitting infections, such as malaria. The donated blood is tested for signs of infectious diseases, such as HIV and hepatitis. The blood is then tested to be sure it is compatible with you in order to minimize the chance of a transfusion reaction. If you or a relative donates blood, this is often done in anticipation of surgery and is not appropriate for emergency situations. It takes many days to process the donated blood. RISKS AND COMPLICATIONS Although transfusion therapy is very safe and saves many lives, the main dangers of transfusion include:   Getting an infectious disease.  Developing a transfusion reaction. This is an allergic reaction to something in the blood you were given. Every precaution is taken to prevent this. The decision to have a blood transfusion has been considered carefully by your caregiver before blood is given. Blood is not given unless the benefits outweigh the risks. You should take the active birth control pill tablet every day until 3 weeks are complete. Do not take the inactive sugar pills, but instead begin the next month's packet. Continue to take 3 or 4 packs continuously of active pills (no inactive, sugar pills), then take the sugar pills for 7 days. During this time you will have a period. It may be lighter than your usual period. Return to taking the active oral contraceptive pills continuously for another 3 or 4 packs and continue to repeat this cycle with a withdrawal period after  3 or 4 packets.

## 2016-11-19 NOTE — Progress Notes (Signed)
Postoperative Follow-up Note: Gyn-Onc  Consult was initially requested by Dr. Sabra Heck for the evaluation of Patricia Gentry 61 y.o. female  CC:  Chief Complaint  Patient presents with  . Vaginal Cancer    Assessment/Plan:  [name]  is a 61 y.o.  year old with squamous cell carcinoma of the vagina. PET is negative for distant disease. Clinically it is a stage IA.  I am recommending surgical excision (upper vaginectomy) via robotic approach. I favor an abdominal approach due to her history of prior severe endoemetriosis, and unclear adhesive disease between the bladder, rectum and vagina. I discussed that surgery is associated with risks of  bleeding, infection, damage to internal organs (such as bladder,ureters, bowels), blood clot, reoperation and rehospitalization. I discussed that an alternative treatment to surgery would be radiation to the vagina. However, this might have more long term morbidity for the patient. I discussed that vaginal shortening could be a long term complication of hysterectomy.  HPI: Patricia Gentry is a very pleasant 61 year old G0 who is seen in consultation at the request of Dr Sabra Heck for [place].  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 She underwent an abdominal hysterectomy in 1984 for endometriosis. Approximate 4 years following this she began developing vaginal dysplasia. This predominantly low-grade and required minimal intervention. Her last interventional procedure was a vaginal laser for high-grade lesion in 2005. Following that she had a series of ASCUS Pap's with negative high-risk HPV. However in 09/02/2015 she had an ASCUS pap with high-risk HPV identified. This prompted a colposcopic evaluation on 09/15/2015 and biopsies of the anterior vagina. Dr. Sabra Heck notes that she sore a lesion on the anterior wall of the vaginal cuff. The biopsy from this returned as VAI and 3 suspicious for  invasion.  The patient denies dyspareunia or vaginal bleeding or abnormal discharge. She is otherwise a very healthy woman who is treated only with estradiol orally and antianxiety medication.  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  Interval Hx:  On January 5th, 2017 she underwent a CO2 laser of the anterior vaginal cuff. The procedure was uncomplicated.  Pap in July 2017 showed ASCUS.  Colpo and directed biopsies of a slightly erythematous area at the cuff showed "fragments of reactive inflamed squamous epithelium, negative for dysplasia or malignancy" on 09/20/16.  On 11/02/16 she underwent colposcopic directed biopsies of the anterior and posterior vaginal cuff. The anterior cuff biopsies confirmed focal invasive SCC. There was no grossly visible lesion on inspection and no mass to palpate.  PET/CT on 11/19/16 showed no vaginal or pelvic mass and no distant metastatic disease .(an area seen in the left breast corresponded to a traumatic injury).   Current Meds:  Outpatient Encounter Prescriptions as of 11/19/2016  Medication Sig  . atorvastatin (LIPITOR) 20 MG tablet   . Biotin 800 MCG TABS Take 1 tablet by mouth daily.  . Cholecalciferol (VITAMIN D3) 2000 units TABS Take 1 tablet by mouth daily.  Marland Kitchen escitalopram (LEXAPRO) 10 MG tablet TAKE ONE (1) TABLET EACH DAY  . estradiol (ESTRACE) 0.5 MG tablet Take 1 tablet (0.5 mg total) by mouth daily.  . Omega-3 Fatty Acids (FISH OIL) 1200 MG CAPS Take 1 capsule by mouth daily.  . Probiotic Product (ALIGN PO) Take by mouth daily.  . ranitidine (ZANTAC) 150 MG tablet Take 150 mg  by mouth as needed for heartburn.  . vitamin B-12 (CYANOCOBALAMIN) 1000 MCG tablet Take 1,000 mcg by mouth daily.  . vitamin C (ASCORBIC ACID) 500 MG tablet Take 500 mg by mouth daily.   Facility-Administered Encounter Medications as of 11/19/2016  Medication  .  fludeoxyglucose F - 18 (FDG) injection 6.7 millicurie    Allergy:  No Known Allergies  Social Hx:   Social History   Social History  . Marital status: Divorced    Spouse name: N/A  . Number of children: N/A  . Years of education: N/A   Occupational History  . Not on file.   Social History Main Topics  . Smoking status: Never Smoker  . Smokeless tobacco: Never Used  . Alcohol use No  . Drug use: No  . Sexual activity: Yes    Partners: Male    Birth control/ protection: Surgical     Comment: LAVH/BSO   Other Topics Concern  . Not on file   Social History Narrative  . No narrative on file    Past Surgical Hx:  Past Surgical History:  Procedure Laterality Date  . CERVICAL CONE BIOPSY  mid  1980's  . CO2 LASER APPLICATION N/A [id number]   Procedure: CO2 LASER APPLICATION OF THE VAGINA;  Surgeon: Everitt Amber, MD;  [facility];  Service: Gynecology;  Laterality: N/A;  . LAPAROSCOPIC ASSISTED VAGINAL HYSTERECTOMY  1984   and BSO  . LASER ABLATION VAGINAL DYSPLASIA  2005   high-grade     Past Medical Hx:  Past Medical History:  Diagnosis Date  . Anxiety   . Complication of anesthesia    slow to wake  . Depression   . GERD (gastroesophageal reflux disease)   . High risk HPV infection    per Pap 09-02-2015  . History of cervical dysplasia    CONIZATION 1980'S  . History of condyloma acuminatum    vulva--  2009  . History of vaginal dysplasia    VAIN 2--  LASER ABLATION 2005  . VAIN III (vaginal intraepithelial neoplasia grade III)   . Wears glasses     Past Gynecological History:  G0, hysterectomy for endometriosis in 1984  Patient's last menstrual period was 10/15/1992.  Family Hx:  Family History  Problem Relation Age of Onset  . Hypertension Mother   . Heart disease Mother   . Hypertension Father   . Heart disease Father   . Hypertension Brother   . Heart disease Brother   . Stroke Brother   . Diabetes Brother     younger  brother  . Osteoporosis Maternal Aunt     Review of Systems:  Constitutional  Feels well,    ENT Normal appearing ears and nares bilaterally Skin/Breast  No rash, sores, jaundice, itching, dryness Cardiovascular  No chest pain, shortness of breath, or edema  Pulmonary  No cough or wheeze.  Gastro Intestinal  No nausea, vomitting, or diarrhoea. No bright red blood per rectum, no abdominal pain, change in bowel movement, or constipation.  Genito Urinary  No frequency, urgency, dysuria, see HPI Musculo Skeletal  No myalgia, arthralgia, joint swelling or pain  Neurologic  No weakness, numbness, change in gait,  Psychology  No depression, anxiety, insomnia.   Vitals:  Blood pressure 126/77, pulse 91, temperature 98.3 F (36.8 C), temperature source Oral, resp. rate 18, height 5\' 4"  (1.626 m), weight 137 lb 11.2 oz (62.5 kg), last menstrual period 10/15/1992, SpO2 97 %.  Physical Exam: WD in  NAD Neck  Supple NROM, without any enlargements.  Lymph Node Survey No cervical supraclavicular or inguinal adenopathy Cardiovascular  Pulse normal rate, regularity and rhythm. S1 and S2 normal.  Lungs  Clear to auscultation bilateraly, without wheezes/crackles/rhonchi. Good air movement.  Skin  No rash/lesions/breakdown  Psychiatry  Alert and oriented to person, place, and time  Abdomen  Normoactive bowel sounds, abdomen soft, non-tender and thin without evidence of hernia.  Back No CVA tenderness Genito Urinary  Vulva/vagina: Normal external female genitalia.  No lesions. No discharge or bleeding.  Bladder/urethra:  No lesions or masses, well supported bladder  Vagina: biopsy site on anterior vagina well healed (in close approximation (within 1 cm) of vaginal cuff.  Cervix: surgically absent  Uterus: surgically absent.  Adnexa: no palpable masses. Rectal  Good tone, no masses no cul de sac nodularity.  Extremities  No bilateral cyanosis, clubbing or edema.    Donaciano Eva, MD  11/19/2016, 2:10 PM

## 2016-11-23 NOTE — Patient Instructions (Signed)
Patricia Gentry  11/23/2016   Your procedure is scheduled on: 12/04/2016    Report to [facility] Main  Entrance take [place]  elevators to 3rd floor to  [place] at    [place] AM.  Call this number if you have problems the morning of surgery [phone]   Remember: ONLY 1 PERSON MAY GO WITH YOU TO SHORT STAY TO GET  READY MORNING OF [place].  Do not eat food or drink liquids :After Midnight.             Eat a light diet the day before surgery.  Examples include: toast, soups, broths, yogurt and mashed potatoes. Avoid carbonated beverages, raw fruits and vegetables and beans.              Drink 2 bottles of Magnesium citrate at 4 pm the day before surgery.        Take these medicines the morning of surgery with A SIP OF WATER: Zantac                                 You may not have any metal on your body including hair pins and              piercings  Do not wear jewelry, make-up, lotions, powders or perfumes, deodorant             Do not wear nail polish.  Do not shave  48 hours prior to surgery.                Do not bring valuables to the hospital. [place].  Contacts, dentures or bridgework may not be worn into surgery.  Leave suitcase in the car. After surgery it may be brought to your room.                  Please read over the following fact sheets you were given: _____________________________________________________________________             [facility] - Preparing for Surgery Before surgery, you can play an important role.  Because skin is not sterile, your skin needs to be as free of germs as possible.  You can reduce the number of germs on your skin by washing with CHG (chlorahexidine gluconate) soap before surgery.  CHG is an antiseptic cleaner which kills germs and bonds with the skin to continue killing germs even after washing. Please DO NOT use if you have an allergy to CHG or  antibacterial soaps.  If your skin becomes reddened/irritated stop using the CHG and inform your nurse when you arrive at Short Stay. Do not shave (including legs and underarms) for at least 48 hours prior to the first CHG shower.  You may shave your face/neck. Please follow these instructions carefully:  1.  Shower with CHG Soap the night before surgery and the  morning of Surgery.  2.  If you choose to wash your hair, wash your hair first as usual with your  normal  shampoo.  3.  After you shampoo, rinse your hair and body thoroughly to remove the  shampoo.  4.  Use CHG as you would any other liquid soap.  You can apply chg directly  to the skin and wash                       Gently with a scrungie or clean washcloth.  5.  Apply the CHG Soap to your body ONLY FROM THE NECK DOWN.   Do not use on face/ open                           Wound or open sores. Avoid contact with eyes, ears mouth and genitals (private parts).                       Wash face,  Genitals (private parts) with your normal soap.             6.  Wash thoroughly, paying special attention to the area where your surgery  will be performed.  7.  Thoroughly rinse your body with warm water from the neck down.  8.  DO NOT shower/wash with your normal soap after using and rinsing off  the CHG Soap.                9.  Pat yourself dry with a clean towel.            10.  Wear clean pajamas.            11.  Place clean sheets on your bed the night of your first shower and do not  sleep with pets. Day of Surgery : Do not apply any lotions/deodorants the morning of surgery.  Please wear clean clothes to the hospital/surgery center.  FAILURE TO FOLLOW THESE INSTRUCTIONS MAY RESULT IN THE CANCELLATION OF YOUR SURGERY PATIENT SIGNATURE_________________________________  NURSE SIGNATURE__________________________________  ________________________________________________________________________  WHAT IS A BLOOD  TRANSFUSION? Blood Transfusion Information  A transfusion is the replacement of blood or some of its parts. Blood is made up of multiple cells which provide different functions.  Red blood cells carry oxygen and are used for blood loss replacement.  White blood cells fight against infection.  Platelets control bleeding.  Plasma helps clot blood.  Other blood products are available for specialized needs, such as hemophilia or other clotting disorders. BEFORE THE TRANSFUSION  Who gives blood for transfusions?   Healthy volunteers who are fully evaluated to make sure their blood is safe. This is blood bank blood. Transfusion therapy is the safest it has ever been in the practice of medicine. Before blood is taken from a donor, a complete history is taken to make sure that person has no history of diseases nor engages in risky social behavior (examples are intravenous drug use or sexual activity with multiple partners). The donor's travel history is screened to minimize risk of transmitting infections, such as malaria. The donated blood is tested for signs of infectious diseases, such as HIV and hepatitis. The blood is then tested to be sure it is compatible with you in order to minimize the chance of a transfusion reaction. If you or a relative donates blood, this is often done in anticipation of surgery and is not appropriate for emergency situations. It takes many days to process the donated blood. RISKS AND COMPLICATIONS Although transfusion therapy is very safe and saves many lives, the main dangers of transfusion include:   Getting an infectious disease.  Developing a transfusion reaction. This  is an allergic reaction to something in the blood you were given. Every precaution is taken to prevent this. The decision to have a blood transfusion has been considered carefully by your caregiver before blood is given. Blood is not given unless the benefits outweigh the risks. AFTER THE  TRANSFUSION  Right after receiving a blood transfusion, you will usually feel much better and more energetic. This is especially true if your red blood cells have gotten low (anemic). The transfusion raises the level of the red blood cells which carry oxygen, and this usually causes an energy increase.  The nurse administering the transfusion will monitor you carefully for complications. HOME CARE INSTRUCTIONS  No special instructions are needed after a transfusion. You may find your energy is better. Speak with your caregiver about any limitations on activity for underlying diseases you may have. SEEK MEDICAL CARE IF:   Your condition is not improving after your transfusion.  You develop redness or irritation at the intravenous (IV) site. SEEK IMMEDIATE MEDICAL CARE IF:  Any of the following symptoms occur over the next 12 hours:  Shaking chills.  You have a temperature by mouth above 102 F (38.9 C), not controlled by medicine.  Chest, back, or muscle pain.  People around you feel you are not acting correctly or are confused.  Shortness of breath or difficulty breathing.  Dizziness and fainting.  You get a rash or develop hives.  You have a decrease in urine output.  Your urine turns a dark color or changes to pink, red, or brown. Any of the following symptoms occur over the next 10 days:  You have a temperature by mouth above 102 F (38.9 C), not controlled by medicine.  Shortness of breath.  Weakness after normal activity.  The white part of the eye turns yellow (jaundice).  You have a decrease in the amount of urine or are urinating less often.  Your urine turns a dark color or changes to pink, red, or brown. Document Released: 09/28/2000 Document Revised: 12/24/2011 Document Reviewed: 05/17/2008 ExitCare Patient Information 2014 [place].  _______________________________________________________________________  Incentive Spirometer  An incentive  spirometer is a tool that can help keep your lungs clear and active. This tool measures how well you are filling your lungs with each breath. Taking long deep breaths may help reverse or decrease the chance of developing breathing (pulmonary) problems (especially infection) following:  A long period of time when you are unable to move or be active. BEFORE THE PROCEDURE   If the spirometer includes an indicator to show your best effort, your nurse or respiratory therapist will set it to a desired goal.  If possible, sit up straight or lean slightly forward. Try not to slouch.  Hold the incentive spirometer in an upright position. INSTRUCTIONS FOR USE  1. Sit on the edge of your bed if possible, or sit up as far as you can in bed or on a chair. 2. Hold the incentive spirometer in an upright position. 3. Breathe out normally. 4. Place the mouthpiece in your mouth and seal your lips tightly around it. 5. Breathe in slowly and as deeply as possible, raising the piston or the ball toward the top of the column. 6. Hold your breath for 3-5 seconds or for as long as possible. Allow the piston or ball to fall to the bottom of the column. 7. Remove the mouthpiece from your mouth and breathe out normally. 8. Rest for a few seconds and repeat Steps 1  through 7 at least 10 times every 1-2 hours when you are awake. Take your time and take a few normal breaths between deep breaths. 9. The spirometer may include an indicator to show your best effort. Use the indicator as a goal to work toward during each repetition. 10. After each set of 10 deep breaths, practice coughing to be sure your lungs are clear. If you have an incision (the cut made at the time of surgery), support your incision when coughing by placing a pillow or rolled up towels firmly against it. Once you are able to get out of bed, walk around indoors and cough well. You may stop using the incentive spirometer when instructed by your caregiver.   RISKS AND COMPLICATIONS  Take your time so you do not get dizzy or light-headed.  If you are in pain, you may need to take or ask for pain medication before doing incentive spirometry. It is harder to take a deep breath if you are having pain. AFTER USE  Rest and breathe slowly and easily.  It can be helpful to keep track of a log of your progress. Your caregiver can provide you with a simple table to help with this. If you are using the spirometer at home, follow these instructions: [place] IF:   You are having difficultly using the spirometer.  You have trouble using the spirometer as often as instructed.  Your pain medication is not giving enough relief while using the spirometer.  You develop fever of 100.5 F (38.1 C) or higher. SEEK IMMEDIATE MEDICAL CARE IF:   You cough up bloody sputum that had not been present before.  You develop fever of 102 F (38.9 C) or greater.  You develop worsening pain at or near the incision site. MAKE SURE YOU:   Understand these instructions.  Will watch your condition.  Will get help right away if you are not doing well or get worse. Document Released: 02/11/2007 Document Revised: 12/24/2011 Document Reviewed: 04/14/2007 [facility] Patient Information 2014 [place], [place].   ________________________________________________________________________

## 2016-11-26 ENCOUNTER — Encounter (HOSPITAL_COMMUNITY)
Admission: RE | Admit: 2016-11-26 | Discharge: 2016-11-26 | Disposition: A | Discharge status: Home / Self Care | Payer: PRIVATE HEALTH INSURANCE | Source: Ambulatory Visit | Attending: Gynecologic Oncology | Admitting: Gynecologic Oncology

## 2016-11-26 ENCOUNTER — Encounter (HOSPITAL_COMMUNITY): Payer: Self-pay

## 2016-11-26 DIAGNOSIS — Z0183 Encounter for blood typing: Secondary | ICD-10-CM | POA: Diagnosis not present

## 2016-11-26 DIAGNOSIS — Z01812 Encounter for preprocedural laboratory examination: Secondary | ICD-10-CM | POA: Insufficient documentation

## 2016-11-26 DIAGNOSIS — C52 Malignant neoplasm of vagina: Secondary | ICD-10-CM | POA: Insufficient documentation

## 2016-11-26 HISTORY — DX: Other specified postprocedural states: Z98.890

## 2016-11-26 HISTORY — DX: Other specified postprocedural states: R11.2

## 2016-11-26 LAB — ABO/RH: ABO/RH(D): O POS

## 2016-11-26 LAB — CBC WITH DIFFERENTIAL/PLATELET
Basophils Absolute: 0 10*3/uL (ref 0.0–0.1)
Basophils Relative: 1 %
EOS ABS: 0.2 10*3/uL (ref 0.0–0.7)
Eosinophils Relative: 4 %
HEMATOCRIT: 38.1 % (ref 36.0–46.0)
Hemoglobin: 12.7 g/dL (ref 12.0–15.0)
LYMPHS ABS: 1.5 10*3/uL (ref 0.7–4.0)
Lymphocytes Relative: 34 %
MCH: 30 pg (ref 26.0–34.0)
MCHC: 33.3 g/dL (ref 30.0–36.0)
MCV: 89.9 fL (ref 78.0–100.0)
MONOS PCT: 9 %
Monocytes Absolute: 0.4 10*3/uL (ref 0.1–1.0)
NEUTROS ABS: 2.3 10*3/uL (ref 1.7–7.7)
NEUTROS PCT: 52 %
Platelets: 372 10*3/uL (ref 150–400)
RBC: 4.24 MIL/uL (ref 3.87–5.11)
RDW: 12.6 % (ref 11.5–15.5)
WBC: 4.4 10*3/uL (ref 4.0–10.5)

## 2016-11-26 LAB — COMPREHENSIVE METABOLIC PANEL
ALBUMIN: 4.1 g/dL (ref 3.5–5.0)
ALT: 31 U/L (ref 14–54)
AST: 27 U/L (ref 15–41)
Alkaline Phosphatase: 137 U/L — ABNORMAL HIGH (ref 38–126)
Anion gap: 8 (ref 5–15)
BILIRUBIN TOTAL: 0.8 mg/dL (ref 0.3–1.2)
BUN: 14 mg/dL (ref 6–20)
CO2: 28 mmol/L (ref 22–32)
Calcium: 9.5 mg/dL (ref 8.9–10.3)
Chloride: 103 mmol/L (ref 101–111)
Creatinine, Ser: 0.64 mg/dL (ref 0.44–1.00)
GFR calc Af Amer: >60 mL/min (ref >60)
GFR calc non Af Amer: >60 mL/min (ref >60)
Glucose, Bld: 85 mg/dL (ref 65–99)
POTASSIUM: 4 mmol/L (ref 3.5–5.1)
Sodium: 139 mmol/L (ref 135–145)
TOTAL PROTEIN: 7.5 g/dL (ref 6.5–8.1)

## 2016-11-26 LAB — URINALYSIS, ROUTINE W REFLEX MICROSCOPIC
BILIRUBIN URINE: NEGATIVE
Bacteria, UA: NONE SEEN
GLUCOSE, UA: NEGATIVE mg/dL
Hgb urine dipstick: NEGATIVE
KETONES UR: NEGATIVE mg/dL
NITRITE: NEGATIVE
PH: 6 (ref 5.0–8.0)
Protein, ur: NEGATIVE mg/dL
RBC / HPF: NONE SEEN RBC/hpf (ref 0–5)
Specific Gravity, Urine: 1.005 (ref 1.005–1.030)

## 2016-11-26 NOTE — Progress Notes (Signed)
U/A done 11/26/16 faxed via EPIC to Dr Denman George

## 2016-12-04 ENCOUNTER — Ambulatory Visit (HOSPITAL_COMMUNITY): Payer: PRIVATE HEALTH INSURANCE | Admitting: Anesthesiology

## 2016-12-04 ENCOUNTER — Encounter (HOSPITAL_COMMUNITY): Payer: Self-pay

## 2016-12-04 ENCOUNTER — Encounter (HOSPITAL_COMMUNITY)
Admission: RE | Disposition: A | Discharge status: Home / Self Care | Payer: Self-pay | Source: Ambulatory Visit | Attending: Gynecologic Oncology

## 2016-12-04 ENCOUNTER — Ambulatory Visit (HOSPITAL_COMMUNITY)
Admission: RE | Admit: 2016-12-04 | Discharge: 2016-12-05 | Disposition: A | Discharge status: Home / Self Care | Payer: PRIVATE HEALTH INSURANCE | Source: Ambulatory Visit | Attending: Gynecologic Oncology | Admitting: Gynecologic Oncology

## 2016-12-04 DIAGNOSIS — K219 Gastro-esophageal reflux disease without esophagitis: Secondary | ICD-10-CM | POA: Insufficient documentation

## 2016-12-04 DIAGNOSIS — F329 Major depressive disorder, single episode, unspecified: Secondary | ICD-10-CM | POA: Insufficient documentation

## 2016-12-04 DIAGNOSIS — A630 Anogenital (venereal) warts: Secondary | ICD-10-CM | POA: Diagnosis not present

## 2016-12-04 DIAGNOSIS — C52 Malignant neoplasm of vagina: Secondary | ICD-10-CM | POA: Diagnosis not present

## 2016-12-04 DIAGNOSIS — F419 Anxiety disorder, unspecified: Secondary | ICD-10-CM | POA: Insufficient documentation

## 2016-12-04 HISTORY — PX: ROBOTIC ASSISTED TOTAL HYSTERECTOMY: SHX6085

## 2016-12-04 LAB — TYPE AND SCREEN
ABO/RH(D): O POS
Antibody Screen: NEGATIVE

## 2016-12-04 SURGERY — ROBOTIC ASSISTED TOTAL HYSTERECTOMY
Anesthesia: General

## 2016-12-04 MED ORDER — CEFAZOLIN SODIUM-DEXTROSE 2-4 GM/100ML-% IV SOLN
2.0000 g | INTRAVENOUS | Status: AC
  Administered 2016-12-04: 2 g via INTRAVENOUS
  Filled 2016-12-04: qty 100

## 2016-12-04 MED ORDER — OXYCODONE-ACETAMINOPHEN 5-325 MG PO TABS: 1.0000 | ORAL_TABLET | ORAL | Status: DC | PRN

## 2016-12-04 MED ORDER — ENOXAPARIN SODIUM 40 MG/0.4ML ~~LOC~~ SOLN
40.0000 mg | SUBCUTANEOUS | Status: AC
  Administered 2016-12-04: 40 mg via SUBCUTANEOUS
  Filled 2016-12-04: qty 0.4

## 2016-12-04 MED ORDER — CEFAZOLIN SODIUM-DEXTROSE 2-4 GM/100ML-% IV SOLN
INTRAVENOUS | Status: AC
  Filled 2016-12-04: qty 100

## 2016-12-04 MED ORDER — ESCITALOPRAM OXALATE 20 MG PO TABS
10.0000 mg | ORAL_TABLET | Freq: Every day | ORAL | Status: DC
  Administered 2016-12-04: 10 mg via ORAL
  Filled 2016-12-04: qty 1

## 2016-12-04 MED ORDER — KETOROLAC TROMETHAMINE 15 MG/ML IJ SOLN
15.0000 mg | Freq: Four times a day (QID) | INTRAMUSCULAR | Status: AC | PRN
Start: 2016-12-04 — End: 2016-12-05
  Administered 2016-12-04 (×2): 15 mg via INTRAVENOUS
  Filled 2016-12-04 (×2): qty 1

## 2016-12-04 MED ORDER — HYDROMORPHONE HCL 2 MG/ML IJ SOLN: 0.2000 mg | INTRAMUSCULAR | Status: DC | PRN

## 2016-12-04 MED ORDER — KCL IN DEXTROSE-NACL 20-5-0.45 MEQ/L-%-% IV SOLN
INTRAVENOUS | Status: DC
  Administered 2016-12-04: 16:00:00 via INTRAVENOUS
  Filled 2016-12-04 (×2): qty 1000

## 2016-12-04 MED ORDER — ATORVASTATIN CALCIUM 20 MG PO TABS
20.0000 mg | ORAL_TABLET | Freq: Every day | ORAL | Status: DC
  Administered 2016-12-04: 20 mg via ORAL
  Filled 2016-12-04: qty 1

## 2016-12-04 MED ORDER — LIDOCAINE 2% (20 MG/ML) 5 ML SYRINGE
INTRAMUSCULAR | Status: DC | PRN
  Administered 2016-12-04: 60 mg via INTRAVENOUS

## 2016-12-04 MED ORDER — SUGAMMADEX SODIUM 200 MG/2ML IV SOLN
INTRAVENOUS | Status: DC | PRN
  Administered 2016-12-04: 200 mg via INTRAVENOUS

## 2016-12-04 MED ORDER — FENTANYL CITRATE (PF) 250 MCG/5ML IJ SOLN
INTRAMUSCULAR | Status: AC
  Filled 2016-12-04: qty 5

## 2016-12-04 MED ORDER — FENTANYL CITRATE (PF) 100 MCG/2ML IJ SOLN
INTRAMUSCULAR | Status: DC | PRN
  Administered 2016-12-04: 50 ug via INTRAVENOUS
  Administered 2016-12-04: 100 ug via INTRAVENOUS

## 2016-12-04 MED ORDER — EPHEDRINE SULFATE-NACL 50-0.9 MG/10ML-% IV SOSY
PREFILLED_SYRINGE | INTRAVENOUS | Status: DC | PRN
  Administered 2016-12-04: 10 mg via INTRAVENOUS

## 2016-12-04 MED ORDER — SCOPOLAMINE 1 MG/3DAYS TD PT72
MEDICATED_PATCH | TRANSDERMAL | Status: DC | PRN
  Administered 2016-12-04: 1 via TRANSDERMAL

## 2016-12-04 MED ORDER — IBUPROFEN 800 MG PO TABS: 800.0000 mg | ORAL_TABLET | Freq: Three times a day (TID) | ORAL | Status: DC | PRN

## 2016-12-04 MED ORDER — MIDAZOLAM HCL 5 MG/5ML IJ SOLN
INTRAMUSCULAR | Status: DC | PRN
  Administered 2016-12-04: 2 mg via INTRAVENOUS

## 2016-12-04 MED ORDER — ONDANSETRON HCL 4 MG/2ML IJ SOLN
INTRAMUSCULAR | Status: DC | PRN
  Administered 2016-12-04: 4 mg via INTRAVENOUS

## 2016-12-04 MED ORDER — ONDANSETRON HCL 4 MG/2ML IJ SOLN: 4.0000 mg | Freq: Four times a day (QID) | INTRAMUSCULAR | Status: DC | PRN

## 2016-12-04 MED ORDER — ROCURONIUM BROMIDE 10 MG/ML (PF) SYRINGE
PREFILLED_SYRINGE | INTRAVENOUS | Status: DC | PRN
  Administered 2016-12-04: 10 mg via INTRAVENOUS
  Administered 2016-12-04: 50 mg via INTRAVENOUS
  Administered 2016-12-04: 10 mg via INTRAVENOUS

## 2016-12-04 MED ORDER — PROPOFOL 10 MG/ML IV BOLUS
INTRAVENOUS | Status: DC | PRN
  Administered 2016-12-04: 140 mg via INTRAVENOUS
  Administered 2016-12-04: 60 mg via INTRAVENOUS

## 2016-12-04 MED ORDER — PROMETHAZINE HCL 25 MG/ML IJ SOLN: 6.2500 mg | INTRAMUSCULAR | Status: DC | PRN

## 2016-12-04 MED ORDER — SCOPOLAMINE 1 MG/3DAYS TD PT72
MEDICATED_PATCH | TRANSDERMAL | Status: AC
  Filled 2016-12-04: qty 1

## 2016-12-04 MED ORDER — LACTATED RINGERS IV SOLN
INTRAVENOUS | Status: DC
  Administered 2016-12-04: 1000 mL via INTRAVENOUS
  Administered 2016-12-04: 13:00:00 via INTRAVENOUS

## 2016-12-04 MED ORDER — FAMOTIDINE 20 MG PO TABS
20.0000 mg | ORAL_TABLET | Freq: Two times a day (BID) | ORAL | Status: DC
  Administered 2016-12-04 – 2016-12-05 (×2): 20 mg via ORAL
  Filled 2016-12-04 (×2): qty 1

## 2016-12-04 MED ORDER — HYDROMORPHONE HCL 1 MG/ML IJ SOLN: 0.2500 mg | INTRAMUSCULAR | Status: DC | PRN

## 2016-12-04 MED ORDER — STERILE WATER FOR IRRIGATION IR SOLN
Status: DC | PRN
  Administered 2016-12-04: 1000 mL

## 2016-12-04 MED ORDER — MIDAZOLAM HCL 2 MG/2ML IJ SOLN
INTRAMUSCULAR | Status: AC
  Filled 2016-12-04: qty 2

## 2016-12-04 MED ORDER — LACTATED RINGERS IR SOLN
Status: DC | PRN
  Administered 2016-12-04: 1000 mL

## 2016-12-04 MED ORDER — ONDANSETRON HCL 4 MG PO TABS: 4.0000 mg | ORAL_TABLET | Freq: Four times a day (QID) | ORAL | Status: DC | PRN

## 2016-12-04 MED ORDER — ENOXAPARIN SODIUM 40 MG/0.4ML ~~LOC~~ SOLN
40.0000 mg | SUBCUTANEOUS | Status: DC
  Administered 2016-12-05: 40 mg via SUBCUTANEOUS
  Filled 2016-12-04: qty 0.4

## 2016-12-04 SURGICAL SUPPLY — 58 items
APPLICATOR SURGIFLO ENDO (HEMOSTASIS) IMPLANT
BAG LAPAROSCOPIC 12 15 PORT 16 (BASKET) IMPLANT
BAG RETRIEVAL 12/15 (BASKET)
CHLORAPREP W/TINT 26ML (MISCELLANEOUS) ×2 IMPLANT
COVER SURGICAL LIGHT HANDLE (MISCELLANEOUS) ×2 IMPLANT
COVER TIP SHEARS 8 DVNC (MISCELLANEOUS) ×2 IMPLANT
COVER TIP SHEARS 8MM DA VINCI (MISCELLANEOUS) ×2
DERMABOND ADVANCED (GAUZE/BANDAGES/DRESSINGS) ×1
DERMABOND ADVANCED .7 DNX12 (GAUZE/BANDAGES/DRESSINGS) ×1 IMPLANT
DRAPE ARM DVNC X/XI (DISPOSABLE) ×4 IMPLANT
DRAPE COLUMN DVNC XI (DISPOSABLE) ×1 IMPLANT
DRAPE DA VINCI XI ARM (DISPOSABLE) ×4
DRAPE DA VINCI XI COLUMN (DISPOSABLE) ×1
DRAPE SHEET LG 3/4 BI-LAMINATE (DRAPES) ×4 IMPLANT
DRAPE SURG IRRIG POUCH 19X23 (DRAPES) ×2 IMPLANT
ELECT REM PT RETURN 9FT ADLT (ELECTROSURGICAL) ×2
ELECTRODE REM PT RTRN 9FT ADLT (ELECTROSURGICAL) ×1 IMPLANT
GLOVE BIO SURGEON STRL SZ 6 (GLOVE) ×8 IMPLANT
GLOVE BIO SURGEON STRL SZ 6.5 (GLOVE) ×4 IMPLANT
GOWN STRL REUS W/ TWL LRG LVL3 (GOWN DISPOSABLE) ×2 IMPLANT
GOWN STRL REUS W/TWL LRG LVL3 (GOWN DISPOSABLE) ×2
HOLDER FOLEY CATH W/STRAP (MISCELLANEOUS) ×2 IMPLANT
IRRIG SUCT STRYKERFLOW 2 WTIP (MISCELLANEOUS) ×2
IRRIGATION SUCT STRKRFLW 2 WTP (MISCELLANEOUS) ×1 IMPLANT
KIT BASIN OR (CUSTOM PROCEDURE TRAY) ×2 IMPLANT
MANIPULATOR UTERINE 4.5 ZUMI (MISCELLANEOUS) IMPLANT
MARKER SKIN DUAL TIP RULER LAB (MISCELLANEOUS) ×2 IMPLANT
OBTURATOR OPTICAL STANDARD 8MM (TROCAR) ×1
OBTURATOR OPTICAL STND 8 DVNC (TROCAR) ×1
OBTURATOR OPTICALSTD 8 DVNC (TROCAR) ×1 IMPLANT
OCCLUDER COLPOPNEUMO (BALLOONS) ×2 IMPLANT
PAD POSITIONING PINK XL (MISCELLANEOUS) ×2 IMPLANT
PORT ACCESS TROCAR AIRSEAL 12 (TROCAR) ×1 IMPLANT
PORT ACCESS TROCAR AIRSEAL 5M (TROCAR) ×1
POUCH SPECIMEN RETRIEVAL 10MM (ENDOMECHANICALS) IMPLANT
SEAL CANN UNIV 5-8 DVNC XI (MISCELLANEOUS) ×4 IMPLANT
SEAL XI 5MM-8MM UNIVERSAL (MISCELLANEOUS) ×4
SET TRI-LUMEN FLTR TB AIRSEAL (TUBING) ×2 IMPLANT
SHEET LAVH (DRAPES) ×2 IMPLANT
SOLUTION ELECTROLUBE (MISCELLANEOUS) ×2 IMPLANT
SURGIFLO W/THROMBIN 8M KIT (HEMOSTASIS) IMPLANT
SUT MNCRL AB 4-0 PS2 18 (SUTURE) ×4 IMPLANT
SUT PROLENE 5 0 CC 1 (SUTURE) IMPLANT
SUT VIC AB 0 CT1 27 (SUTURE) ×1
SUT VIC AB 0 CT1 27XBRD ANTBC (SUTURE) ×1 IMPLANT
SUT VIC AB 2-0 SH 27 (SUTURE) ×1
SUT VIC AB 2-0 SH 27X BRD (SUTURE) ×1 IMPLANT
SUT VIC AB 3-0 SH 27 (SUTURE) ×1
SUT VIC AB 3-0 SH 27XBRD (SUTURE) ×1 IMPLANT
SYR 50ML LL SCALE MARK (SYRINGE) ×2 IMPLANT
TOWEL OR 17X26 10 PK STRL BLUE (TOWEL DISPOSABLE) ×2 IMPLANT
TOWEL OR NON WOVEN STRL DISP B (DISPOSABLE) ×2 IMPLANT
TRAP SPECIMEN MUCOUS 40CC (MISCELLANEOUS) IMPLANT
TRAY FOLEY W/METER SILVER 16FR (SET/KITS/TRAYS/PACK) ×2 IMPLANT
TRAY LAPAROSCOPIC (CUSTOM PROCEDURE TRAY) ×2 IMPLANT
TROCAR BLADELESS OPT 5 100 (ENDOMECHANICALS) ×2 IMPLANT
UNDERPAD 30X30 INCONTINENT (UNDERPADS AND DIAPERS) ×2 IMPLANT
WATER STERILE IRR 1500ML POUR (IV SOLUTION) ×2 IMPLANT

## 2016-12-04 NOTE — Discharge Instructions (Signed)
12/04/2016  Return to work: 4 weeks  Activity: 1. Be up and out of the bed during the day.  Take a nap if needed.  You may walk up steps but be careful and use the hand rail.  Stair climbing will tire you more than you think, you may need to stop part way and rest.   2. No lifting or straining for 6 weeks.  3. No driving for 1 weeks.  Do Not drive if you are taking narcotic pain medicine.  4. Shower daily.  Use soap and water on your incision and pat dry; don't rub.   5. No sexual activity and nothing in the vagina for 8 weeks.  Medications:  - Take ibuprofen and tylenol first line for pain control. Take these regularly (every 6 hours) to decrease the build up of pain.  - If necessary, for severe pain not relieved by ibuprofen, take percocet.  - While taking percocet you should take sennakot every night to reduce the likelihood of constipation. If this causes diarrhea, stop its use.  Diet: 1. Low sodium Heart Healthy Diet is recommended.  2. It is safe to use a laxative if you have difficulty moving your bowels.   Wound Care: 1. Keep clean and dry.  Shower daily.  Reasons to call the Doctor:   Fever - Oral temperature greater than 100.4 degrees Fahrenheit  Foul-smelling vaginal discharge  Difficulty urinating  Nausea and vomiting  Increased pain at the site of the incision that is unrelieved with pain medicine.  Difficulty breathing with or without chest pain  New calf pain especially if only on one side  Sudden, continuing increased vaginal bleeding with or without clots.   Follow-up: 1. See Everitt Amber in 3 weeks.  Contacts: For questions or concerns you should contact:  Dr. Everitt Amber at [phone] After hours and on week-ends call [phone] and ask to speak to the physician on call for Gynecologic Oncology

## 2016-12-04 NOTE — Anesthesia Postprocedure Evaluation (Addendum)
Anesthesia Post Note  Patient: Patricia Gentry  Procedure(s) Performed: Procedure(s) (LRB): ROBOTIC ASSISTED UPPER VAGINECTOMY (N/A)  Patient location during evaluation: PACU Anesthesia Type: General Level of consciousness: awake and alert, oriented and patient cooperative Pain management: pain level controlled Vital Signs Assessment: post-procedure vital signs reviewed and stable Respiratory status: spontaneous breathing, nonlabored ventilation and respiratory function stable Cardiovascular status: blood pressure returned to baseline and stable Postop Assessment: no signs of nausea or vomiting Anesthetic complications: no       Last Vitals:  Vitals:   12/04/16 1315 12/04/16 1330  BP: 133/72 130/73  Pulse: 85 84  Resp: 20 17  Temp:      Last Pain:  Vitals:   12/04/16 1306  TempSrc:   PainSc: 0-No pain                 Kory Rains,E. Elloise Roark

## 2016-12-04 NOTE — Anesthesia Preprocedure Evaluation (Addendum)
Anesthesia Evaluation  Patient identified by MRN, date of birth, ID band Patient awake    History of Anesthesia Complications (+) PONV  Airway Mallampati: II   Neck ROM: Full  Mouth opening: Limited Mouth Opening  Dental  (+) Teeth Intact, Dental Advisory Given   Pulmonary neg pulmonary ROS,    breath sounds clear to auscultation       Cardiovascular negative cardio ROS   Rhythm:Regular Rate:Normal     Neuro/Psych    GI/Hepatic Neg liver ROS, GERD  ,  Endo/Other  negative endocrine ROS  Renal/GU negative Renal ROS   HPV    Musculoskeletal   Abdominal   Peds  Hematology negative hematology ROS (+)   Anesthesia Other Findings   Reproductive/Obstetrics                            Anesthesia Physical Anesthesia Plan  ASA: I  Anesthesia Plan: General   Post-op Pain Management:    Induction: Intravenous  Airway Management Planned: Oral ETT  Additional Equipment:   Intra-op Plan:   Post-operative Plan: Extubation in OR  Informed Consent: I have reviewed the patients History and Physical, chart, labs and discussed the procedure including the risks, benefits and alternatives for the proposed anesthesia with the patient or authorized representative who has indicated his/her understanding and acceptance.   Dental advisory given  Plan Discussed with: CRNA  Anesthesia Plan Comments:         Anesthesia Quick Evaluation

## 2016-12-04 NOTE — Anesthesia Procedure Notes (Signed)
Procedure Name: Intubation Performed by: Gean Maidens Pre-anesthesia Checklist: Emergency Drugs available, Suction available, Patient being monitored and Timeout performed Patient Re-evaluated:Patient Re-evaluated prior to inductionOxygen Delivery Method: Circle system utilized Preoxygenation: Pre-oxygenation with 100% oxygen Intubation Type: IV induction Ventilation: Mask ventilation without difficulty Laryngoscope Size: Mac and 3 Grade View: Grade I Tube type: Oral Tube size: 7.0 mm Number of attempts: 1 Airway Equipment and Method: Stylet Placement Confirmation: ETT inserted through vocal cords under direct vision,  positive ETCO2,  CO2 detector and breath sounds checked- equal and bilateral Secured at: 21 cm Tube secured with: Tape Dental Injury: Teeth and Oropharynx as per pre-operative assessment

## 2016-12-04 NOTE — Transfer of Care (Signed)
Immediate Anesthesia Transfer of Care Note  Patient: Patricia Gentry  Procedure(s) Performed: Procedure(s): ROBOTIC ASSISTED UPPER VAGINECTOMY (N/A)  Patient Location: PACU  Anesthesia Type:General  Level of Consciousness: sedated, patient cooperative and responds to stimulation  Airway & Oxygen Therapy: Patient Spontanous Breathing and Patient connected to face mask oxygen  Post-op Assessment: Report given to RN and Post -op Vital signs reviewed and stable  Post vital signs: Reviewed and stable  Last Vitals:  Vitals:   12/04/16 0858  BP: 113/81  Pulse: 86  Resp: 16  Temp: 36.8 C    Last Pain:  Vitals:   12/04/16 1012  TempSrc:   PainSc: 0-No pain      Patients Stated Pain Goal: 4 ([id number])  Complications: No apparent anesthesia complications

## 2016-12-04 NOTE — Interval H&P Note (Signed)
History and Physical Interval Note:  12/04/2016 9:56 AM  Patricia Gentry  has presented today for surgery, with the diagnosis of VAGINAL CANCER  The various methods of treatment have been discussed with the patient and family. After consideration of risks, benefits and other options for treatment, the patient has consented to  Procedure(s): ROBOTIC ASSISTED RIGHT UPPER VAGINECTOMY (Right) as a surgical intervention .  The patient's history has been reviewed, patient examined, no change in status, stable for surgery.  I have reviewed the patient's chart and labs.  Questions were answered to the patient's satisfaction.     Donaciano Eva

## 2016-12-04 NOTE — H&P (View-Only) (Signed)
Postoperative Follow-up Note: Gyn-Onc  Consult was initially requested by Dr. Sabra Heck for the evaluation of Patricia Gentry 61 y.o. female  CC:  Chief Complaint  Patient presents with  . Vaginal Cancer    Assessment/Plan:  [name]  is a 61 y.o.  year old with squamous cell carcinoma of the vagina. PET is negative for distant disease. Clinically it is a stage IA.  I am recommending surgical excision (upper vaginectomy) via robotic approach. I favor an abdominal approach due to her history of prior severe endoemetriosis, and unclear adhesive disease between the bladder, rectum and vagina. I discussed that surgery is associated with risks of  bleeding, infection, damage to internal organs (such as bladder,ureters, bowels), blood clot, reoperation and rehospitalization. I discussed that an alternative treatment to surgery would be radiation to the vagina. However, this might have more long term morbidity for the patient. I discussed that vaginal shortening could be a long term complication of hysterectomy.  HPI: Patricia Gentry is a very pleasant 61 year old G0 who is seen in consultation at the request of Dr Sabra Heck for [employer].  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 She underwent an abdominal hysterectomy in 1984 for endometriosis. Approximate 4 years following this she began developing vaginal dysplasia. This predominantly low-grade and required minimal intervention. Her last interventional procedure was a vaginal laser for high-grade lesion in 2005. Following that she had a series of ASCUS Pap's with negative high-risk HPV. However in 09/02/2015 she had an ASCUS pap with high-risk HPV identified. This prompted a colposcopic evaluation on 09/15/2015 and biopsies of the anterior vagina. Dr. Sabra Heck notes that she sore a lesion on the anterior wall of the vaginal cuff. The biopsy from this returned as VAI and 3 suspicious for  invasion.  The patient denies dyspareunia or vaginal bleeding or abnormal discharge. She is otherwise a very healthy woman who is treated only with estradiol orally and antianxiety medication.  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  Interval Hx:  On January 5th, 2017 she underwent a CO2 laser of the anterior vaginal cuff. The procedure was uncomplicated.  Pap in July 2017 showed ASCUS.  Colpo and directed biopsies of a slightly erythematous area at the cuff showed "fragments of reactive inflamed squamous epithelium, negative for dysplasia or malignancy" on 09/20/16.  On 11/02/16 she underwent colposcopic directed biopsies of the anterior and posterior vaginal cuff. The anterior cuff biopsies confirmed focal invasive SCC. There was no grossly visible lesion on inspection and no mass to palpate.  PET/CT on 11/19/16 showed no vaginal or pelvic mass and no distant metastatic disease .(an area seen in the left breast corresponded to a traumatic injury).   Current Meds:  Outpatient Encounter Prescriptions as of 11/19/2016  Medication Sig  . atorvastatin (LIPITOR) 20 MG tablet   . Biotin 800 MCG TABS Take 1 tablet by mouth daily.  . Cholecalciferol (VITAMIN D3) 2000 units TABS Take 1 tablet by mouth daily.  Marland Kitchen escitalopram (LEXAPRO) 10 MG tablet TAKE ONE (1) TABLET EACH DAY  . estradiol (ESTRACE) 0.5 MG tablet Take 1 tablet (0.5 mg total) by mouth daily.  . Omega-3 Fatty Acids (FISH OIL) 1200 MG CAPS Take 1 capsule by mouth daily.  . Probiotic Product (ALIGN PO) Take by mouth daily.  . ranitidine (ZANTAC) 150 MG tablet Take 150 mg  by mouth as needed for heartburn.  . vitamin B-12 (CYANOCOBALAMIN) 1000 MCG tablet Take 1,000 mcg by mouth daily.  . vitamin C (ASCORBIC ACID) 500 MG tablet Take 500 mg by mouth daily.   Facility-Administered Encounter Medications as of 11/19/2016  Medication  .  fludeoxyglucose F - 18 (FDG) injection 6.7 millicurie    Allergy:  No Known Allergies  Social Hx:   Social History   Social History  . Marital status: Divorced    Spouse name: N/A  . Number of children: N/A  . Years of education: N/A   Occupational History  . Not on file.   Social History Main Topics  . Smoking status: Never Smoker  . Smokeless tobacco: Never Used  . Alcohol use No  . Drug use: No  . Sexual activity: Yes    Partners: Male    Birth control/ protection: Surgical     Comment: LAVH/BSO   Other Topics Concern  . Not on file   Social History Narrative  . No narrative on file    Past Surgical Hx:  Past Surgical History:  Procedure Laterality Date  . CERVICAL CONE BIOPSY  mid  1980's  . CO2 LASER APPLICATION N/A [id number]   Procedure: CO2 LASER APPLICATION OF THE VAGINA;  Surgeon: Everitt Amber, MD;  Location: [facility];  Service: Gynecology;  Laterality: N/A;  . LAPAROSCOPIC ASSISTED VAGINAL HYSTERECTOMY  1984   and BSO  . LASER ABLATION VAGINAL DYSPLASIA  2005   high-grade     Past Medical Hx:  Past Medical History:  Diagnosis Date  . Anxiety   . Complication of anesthesia    slow to wake  . Depression   . GERD (gastroesophageal reflux disease)   . High risk HPV infection    per Pap 09-02-2015  . History of cervical dysplasia    CONIZATION 1980'S  . History of condyloma acuminatum    vulva--  2009  . History of vaginal dysplasia    VAIN 2--  LASER ABLATION 2005  . VAIN III (vaginal intraepithelial neoplasia grade III)   . Wears glasses     Past Gynecological History:  G0, hysterectomy for endometriosis in 1984  Patient's last menstrual period was 10/15/1992.  Family Hx:  Family History  Problem Relation Age of Onset  . Hypertension Mother   . Heart disease Mother   . Hypertension Father   . Heart disease Father   . Hypertension Brother   . Heart disease Brother   . Stroke Brother   . Diabetes Brother     younger  brother  . Osteoporosis Maternal Aunt     Review of Systems:  Constitutional  Feels well,    ENT Normal appearing ears and nares bilaterally Skin/Breast  No rash, sores, jaundice, itching, dryness Cardiovascular  No chest pain, shortness of breath, or edema  Pulmonary  No cough or wheeze.  Gastro Intestinal  No nausea, vomitting, or diarrhoea. No bright red blood per rectum, no abdominal pain, change in bowel movement, or constipation.  Genito Urinary  No frequency, urgency, dysuria, see HPI Musculo Skeletal  No myalgia, arthralgia, joint swelling or pain  Neurologic  No weakness, numbness, change in gait,  Psychology  No depression, anxiety, insomnia.   Vitals:  Blood pressure 126/77, pulse 91, temperature 98.3 F (36.8 C), temperature source Oral, resp. rate 18, height 5\' 4"  (1.626 m), weight 137 lb 11.2 oz (62.5 kg), last menstrual period 10/15/1992, SpO2 97 %.  Physical Exam: WD in  NAD Neck  Supple NROM, without any enlargements.  Lymph Node Survey No cervical supraclavicular or inguinal adenopathy Cardiovascular  Pulse normal rate, regularity and rhythm. S1 and S2 normal.  Lungs  Clear to auscultation bilateraly, without wheezes/crackles/rhonchi. Good air movement.  Skin  No rash/lesions/breakdown  Psychiatry  Alert and oriented to person, place, and time  Abdomen  Normoactive bowel sounds, abdomen soft, non-tender and thin without evidence of hernia.  Back No CVA tenderness Genito Urinary  Vulva/vagina: Normal external female genitalia.  No lesions. No discharge or bleeding.  Bladder/urethra:  No lesions or masses, well supported bladder  Vagina: biopsy site on anterior vagina well healed (in close approximation (within 1 cm) of vaginal cuff.  Cervix: surgically absent  Uterus: surgically absent.  Adnexa: no palpable masses. Rectal  Good tone, no masses no cul de sac nodularity.  Extremities  No bilateral cyanosis, clubbing or edema.    Donaciano Eva, MD  11/19/2016, 2:10 PM

## 2016-12-04 NOTE — Op Note (Signed)
OPERATIVE NOTE 12/04/16  Surgeon: Donaciano Eva   Assistants: Lahoma Crocker (an MD assistant was necessary for tissue manipulation, management of robotic instrumentation, retraction and positioning due to the complexity of the case and hospital policies).   Anesthesia: General endotracheal anesthesia  ASA Class: 3   Pre-operative Diagnosis: vaginal cancer (clinical stage IA).  Post-operative Diagnosis: same  Operation: Robotic-assisted upper vaginectomy  Surgeon: Donaciano Eva  Assistant Surgeon: Lahoma Crocker MD  Anesthesia: GET  Urine Output: 200  Operative Findings:  : no grossly visible lesion  Estimated Blood Loss:  less than 50 mL      Total IV Fluids: 600 ml         Specimens: upper vagina with marking stitch at 12 o'clock anterior distal margin         Complications:  None; patient tolerated the procedure well.         Disposition: PACU - hemodynamically stable.  Procedure Details  The patient was seen in the Holding Room. The risks, benefits, complications, treatment options, and expected outcomes were discussed with the patient.  The patient concurred with the proposed plan, giving informed consent.  The site of surgery properly noted/marked. The patient was identified as Patricia Gentry and the procedure verified as a Robotic-assisted upper vaginectomy. A Time Out was held and the above information confirmed.  After induction of anesthesia, the patient was draped and prepped in the usual sterile manner. Pt was placed in supine position after anesthesia and draped and prepped in the usual sterile manner. The abdominal drape was placed after the CholoraPrep had been allowed to dry for 3 minutes.  Her arms were tucked to her side with all appropriate precautions.  The chest was secured to the table.  The patient was placed in the semi-lithotomy position in [place].  The perineum was prepped with Betadine.  Foley catheter was placed.  An EEA  sizer (large size) was placed vaginally to define the vaginal fornices.  A second time-out was performed.  OG tube placement was confirmed and to suction.    Procedure:   Next, a 10 mm skin incision was made 1 cm below the subcostal margin in the midclavicular line.  The 5 mm Optiview port and scope was used for direct entry.  Opening pressure was under 10 mm CO2.  Upon entry it was unclear if there was an abrasion to the anterior gastric wall. No bleeding was noted. The stomach remained distended (no blood in OG tube). The abdomen was insufflated and the findings were noted as above.   At this point and all points during the procedure, the patient's intra-abdominal pressure did not exceed 15 mmHg. Next, a 10 mm skin incision was made at the umbilicus and a right and left port was placed about 10 cm lateral to the robot port on the right and left side.  A fourth arm was placed in the left lower quadrant 2 cm above and superior and medial to the anterior superior iliac spine.  All ports were placed under direct visualization.    The robot was docked to evaluate the upper abdomen. For 30 minutes close inspection of the stomach and transverse colon in their entirety took place to ensure that there was no injury to either stomach and TV colon. The lesser sac was entered and inspected to confirm no injury (including none to posterior wall of stomach and transverse colon).  The patient was placed in steep Trendelenburg.  Bowel was away into  the upper abdomen.  The robot was docked in the normal manner.  The right retroperitoneum in the pelvis was opened parallel to the IP ligament. The right paravesical space was developed with monopolar and sharp dissection. It was held open with tension on the median umbilical ligament with the forth arm. The pararectal space was opened with blunt and sharp dissection to mobilize the ureter off of the medial surface of the internal iliac artery. The medial leaf of the broad  ligament containing the ureter was held medially (opening the pararectal space) by the assistant's grasper.   The same procedure with the same steps was performed on the patient's left side. The left retroperitoneum in the pelvis was opened parallel to the IP ligament. The left paravesical space was developed with monopolar and sharp dissection. It was held open with tension on the median umbilical ligament with the forth arm. The pararectal space was opened with blunt and sharp dissection to mobilize the ureter off of the medial surface of the internal iliac artery. The medial leaf of the broad ligament containing the ureter was held medially (opening the pararectal space) by the assistant's grasper.  The right ureter was skeletonized off of its attachments to the broad ligament and mobilized laterally. Using meticulous blunt and sparing monopolar dissection in short controlled bursts, the right ureter was untunnelled from under the right uterine artery. The bladder flap was developed and the bladder flap was taken down to below the level 3cm below the rounded portion of the EEA sizer. This then definied the anterior bladder pillar. The uterine artery was bipolar fulgarated to seal it, then transected. The ureter was dissected off of its attachments to the anterior vagina. The posterior bladder pillar was also dissected with monopolar scissors from the upper vagina.  The rectovaginal septum was entered posteriorally with sharp dissection and the rectum was dissected off of the vagina. The right paravaginal tissues were tubularized around the vagina on the right at the inferior boundary of the dissection.  The left ureter was skeletonized off of its attachments to the broad ligament and mobilized laterally. Using meticulous blunt and sparing monopolar dissection in short controlled bursts, the left ureter was untunnelled from under the left uterine artery. The left anterior vessicouterine ligament was  developed and the bladder flap was taken down to below the level of the tumor and below the rounded portion of the EEA sizer. This then definied the anterior bladder pillar. The ureter was dissected off of its attachments to the anterior vagina. The anterior bladder pillar was skeletonized and sealed with bipolar energy while the ureter was deflected distally. The posterior bladder pillar was also dissected with monopolar scissors from the upper vagina.  The rectovaginal septum was entered posteriorally with sharp dissection and the rectum was dissected off of the vagina. The left paravaginal tissues were tubularized around the vagina on the left at the inferior boundary of the dissection.  The colpotomy was made below the level of the EEA sizer bulb (approximately 3cm from the apex. This was completed circumferentially. Prior to specimen retrieval, a marking stitch was placed at the anterior 12 o'clock (midline) distal vaginal margin.    The colpotomy at the vaginal cuff was closed with two 0-Vicryl on a CT1 needle in a figure of 8 manner.  Irrigation was used and excellent hemostasis was achieved.  At this point in the procedure was completed.  Robotic instruments were removed under direct visulaization.  The robot was undocked. The laparoscope was  again used toe inspect the upper abdomen for evidence of visceral injury and none was noted. The 10 mm ports were closed with Vicryl on a UR-5 needle and the fascia was closed with 0 Vicryl on a UR-5 needle.  The skin was closed with 4-0 Vicryl in a subcuticular manner.  Dermabond was applied.  Sponge, lap and needle counts correct x 2.  The patient was taken to the recovery room in stable condition.  The vagina was swabbed with  minimal bleeding noted.   All instrument and needle counts were correct x  3.   The patient was transferred to the recovery room in a stable condition.  Donaciano Eva, MD

## 2016-12-05 DIAGNOSIS — C52 Malignant neoplasm of vagina: Secondary | ICD-10-CM | POA: Diagnosis not present

## 2016-12-05 LAB — CBC
HCT: 38 % (ref 36.0–46.0)
HEMOGLOBIN: 12.5 g/dL (ref 12.0–15.0)
MCH: 30.4 pg (ref 26.0–34.0)
MCHC: 32.9 g/dL (ref 30.0–36.0)
MCV: 92.5 fL (ref 78.0–100.0)
Platelets: 209 10*3/uL (ref 150–400)
RBC: 4.11 MIL/uL (ref 3.87–5.11)
RDW: 13 % (ref 11.5–15.5)
WBC: 6.2 10*3/uL (ref 4.0–10.5)

## 2016-12-05 LAB — BASIC METABOLIC PANEL
ANION GAP: 5 (ref 5–15)
BUN: 13 mg/dL (ref 6–20)
CALCIUM: 8.5 mg/dL — AB (ref 8.9–10.3)
CO2: 30 mmol/L (ref 22–32)
Chloride: 103 mmol/L (ref 101–111)
Creatinine, Ser: 0.64 mg/dL (ref 0.44–1.00)
Glucose, Bld: 95 mg/dL (ref 65–99)
Potassium: 3.9 mmol/L (ref 3.5–5.1)
Sodium: 138 mmol/L (ref 135–145)

## 2016-12-05 MED ORDER — OXYCODONE-ACETAMINOPHEN 5-325 MG PO TABS: 1.0000 | ORAL_TABLET | ORAL | 0 refills | Status: DC | PRN

## 2016-12-05 MED ORDER — SENNA 8.6 MG PO TABS: 1.0000 | ORAL_TABLET | Freq: Every day | ORAL | 0 refills | Status: AC

## 2016-12-05 NOTE — Discharge Summary (Signed)
Physician Discharge Summary  Patient ID: Patricia Gentry MRN: [id number] DOB/AGE: [birth year] 61 y.o.  Admit date: 12/04/2016 Discharge date: 12/05/2016  Admission Diagnoses: Vaginal cancer [facility])  Discharge Diagnoses:  Principal Problem:   Vaginal cancer [facility])   Discharged Condition: good  Hospital Course:  1/ patient was admitted on 12/04/16 for a robotic assisted upper vaginectomy for vaginal cancer 2/ surgery was uncomplicated  3/ on postoperative day 1 the patient was meeting discharge criteria: tolerating PO, voiding urine, ambulating, pain well controlled on oral medications.  4/ new medications on discharge include percocet prn and sennacot.   Consults: None  Significant Diagnostic Studies: labs:  CBC    Component Value Date/Time   WBC 6.2 12/05/2016 0807   RBC 4.11 12/05/2016 0807   HGB 12.5 12/05/2016 0807   HCT 38.0 12/05/2016 0807   PLT 209 12/05/2016 0807   MCV 92.5 12/05/2016 0807   MCH 30.4 12/05/2016 0807   MCHC 32.9 12/05/2016 0807   RDW 13.0 12/05/2016 0807   LYMPHSABS 1.5 11/26/2016 0900   MONOABS 0.4 11/26/2016 0900   EOSABS 0.2 11/26/2016 0900   BASOSABS 0.0 11/26/2016 0900    Treatments: surgery: see above  Discharge Exam: Blood pressure 108/67, pulse 74, temperature 98.4 F (36.9 C), temperature source Oral, resp. rate 16, height 5\' 4"  (1.626 m), weight 131 lb (59.4 kg), last menstrual period 10/15/1992, SpO2 97 %. General appearance: alert and cooperative GI: soft, non-tender; bowel sounds normal; no masses,  no organomegaly Incision/Wound: all 5 clean and in tact. Pressure dressing on left mid abdominal site. No blood on dressing.  Disposition: 01-Home or Self Care  Discharge Instructions    (HEART FAILURE PATIENTS) Call MD:  Anytime you have any of the following symptoms: 1) 3 pound weight gain in 24 hours or 5 pounds in 1 week 2) shortness of breath, with or without a dry hacking cough 3) swelling in the hands, feet or stomach 4) if you have  to sleep on extra pillows at night in order to breathe.    Complete by:  As directed    Call MD for:  difficulty breathing, headache or visual disturbances    Complete by:  As directed    Call MD for:  extreme fatigue    Complete by:  As directed    Call MD for:  hives    Complete by:  As directed    Call MD for:  persistant dizziness or light-headedness    Complete by:  As directed    Call MD for:  persistant nausea and vomiting    Complete by:  As directed    Call MD for:  redness, tenderness, or signs of infection (pain, swelling, redness, odor or green/yellow discharge around incision site)    Complete by:  As directed    Call MD for:  severe uncontrolled pain    Complete by:  As directed    Call MD for:  temperature >100.4    Complete by:  As directed    Diet - low sodium heart healthy    Complete by:  As directed    Diet general    Complete by:  As directed    Driving Restrictions    Complete by:  As directed    No driving for 7 days or until off narcotic pain medication   Increase activity slowly    Complete by:  As directed    Remove dressing in 24 hours    Complete by:  As directed  Sexual Activity Restrictions    Complete by:  As directed    No intercourse for 6 weeks     Allergies as of 12/05/2016   No Known Allergies     Medication List    TAKE these medications   atorvastatin 20 MG tablet Commonly known as:  LIPITOR Take 20 mg by mouth daily at 6 PM.   Cinnamon 500 MG Tabs Take 500 mg by mouth daily.   escitalopram 10 MG tablet Commonly known as:  LEXAPRO TAKE ONE (1) TABLET EACH DAY What changed:  See the new instructions.   estradiol 0.5 MG tablet Commonly known as:  ESTRACE Take 1 tablet (0.5 mg total) by mouth daily.   Fish Oil 1000 MG Caps Take 1,000 mg by mouth daily.   ibuprofen 200 MG tablet Commonly known as:  ADVIL,MOTRIN Take 400 mg by mouth every 6 (six) hours as needed for mild pain.   naproxen sodium 220 MG tablet Commonly  known as:  ANAPROX Take 220 mg by mouth daily as needed (PAIN).   oxyCODONE-acetaminophen 5-325 MG tablet Commonly known as:  PERCOCET/ROXICET Take 1-2 tablets by mouth every 4 (four) hours as needed (moderate to severe pain (when tolerating fluids)).   ranitidine 150 MG tablet Commonly known as:  ZANTAC Take 150 mg by mouth 2 (two) times daily as needed (ACID REFLUX).   senna 8.6 MG Tabs tablet Commonly known as:  SENOKOT Take 1 tablet (8.6 mg total) by mouth at bedtime.   vitamin C 500 MG tablet Commonly known as:  ASCORBIC ACID Take 500 mg by mouth daily.   Vitamin D3 2000 units Tabs Take 1 tablet by mouth daily.        Signed: Donaciano Eva 12/05/2016, 8:48 AM

## 2016-12-10 ENCOUNTER — Telehealth: Payer: Self-pay | Admitting: Gynecologic Oncology

## 2016-12-10 ENCOUNTER — Telehealth: Payer: Self-pay | Admitting: Obstetrics & Gynecology

## 2016-12-10 NOTE — Telephone Encounter (Signed)
Dr. Quincy Simmonds, please review patient message and advise on f/u?  Cc: Dr. Sabra Heck

## 2016-12-10 NOTE — Telephone Encounter (Signed)
Informed patient of results of stage IA vaginal cancer, very small lesion (38mm) with focal LVSI, but negative margins. No adjuvant therapy recommended at this time, but do recommend close surveillance with inspection and pelvic exam every 4 months x 2 years, and annual pap cytology and HPV. Will then space visits to 6 monthly.  Donaciano Eva, MD

## 2016-12-10 NOTE — Telephone Encounter (Signed)
Patient is unclear about when she needs follow up with Dr.Miller. Patient said that [name] referred her to [name] and she had surgery last week.

## 2016-12-10 NOTE — Telephone Encounter (Signed)
Please contact Dr. Serita Grit office to see if she will be following the patient for her rechecks or if she will be alternating with Dr. Denman George.  I see she has an appointment with Dr. Denman George for a post op visit on 12/25/16. We can then clarify this for the patient.  She will still need to see Dr. Sabra Heck for her annual exams.

## 2016-12-11 NOTE — Telephone Encounter (Signed)
Spoke with Patricia Gentry at [facility]. Called to clarify patient f/u as seen below per Dr. Quincy Simmonds. Patient has 12/25/16 f/u with Dr. Denman George, will discuss f/u at that appointment. Advise patient appointments typically will be 6 month alternating, this can change depending on diagnosis to 3 month.   Call to patient, advised as seen above.  Patient will wait to see Dr. Denman George on 12/25/16 and return call for scheduling AEX with Dr. Sabra Heck. Patient verbalizes understanding and is agreeable.  Routing to provider for final review. Patient is agreeable to disposition. Will close encounter.  Cc: Dr.Miller

## 2016-12-11 NOTE — Telephone Encounter (Signed)
Call to Dr. Denman George at [facility], Left message to call Sharee Pimple at [phone].

## 2016-12-25 ENCOUNTER — Ambulatory Visit: Payer: PRIVATE HEALTH INSURANCE | Admitting: Obstetrics & Gynecology

## 2016-12-25 ENCOUNTER — Ambulatory Visit: Payer: PRIVATE HEALTH INSURANCE | Attending: Gynecologic Oncology | Admitting: Gynecologic Oncology

## 2016-12-25 ENCOUNTER — Encounter: Payer: Self-pay | Admitting: Gynecologic Oncology

## 2016-12-25 VITALS — BP 133/87 | HR 94 | Temp 98.2°F | Resp 20 | Wt 134.1 lb

## 2016-12-25 DIAGNOSIS — F329 Major depressive disorder, single episode, unspecified: Secondary | ICD-10-CM | POA: Diagnosis not present

## 2016-12-25 DIAGNOSIS — D072 Carcinoma in situ of vagina: Secondary | ICD-10-CM

## 2016-12-25 DIAGNOSIS — Z8742 Personal history of other diseases of the female genital tract: Secondary | ICD-10-CM | POA: Diagnosis not present

## 2016-12-25 DIAGNOSIS — Z79899 Other long term (current) drug therapy: Secondary | ICD-10-CM | POA: Diagnosis not present

## 2016-12-25 DIAGNOSIS — Z833 Family history of diabetes mellitus: Secondary | ICD-10-CM | POA: Insufficient documentation

## 2016-12-25 DIAGNOSIS — C52 Malignant neoplasm of vagina: Secondary | ICD-10-CM | POA: Diagnosis not present

## 2016-12-25 DIAGNOSIS — Z8249 Family history of ischemic heart disease and other diseases of the circulatory system: Secondary | ICD-10-CM | POA: Insufficient documentation

## 2016-12-25 DIAGNOSIS — F419 Anxiety disorder, unspecified: Secondary | ICD-10-CM | POA: Insufficient documentation

## 2016-12-25 DIAGNOSIS — Z90710 Acquired absence of both cervix and uterus: Secondary | ICD-10-CM | POA: Insufficient documentation

## 2016-12-25 DIAGNOSIS — Z823 Family history of stroke: Secondary | ICD-10-CM | POA: Insufficient documentation

## 2016-12-25 DIAGNOSIS — Z9889 Other specified postprocedural states: Secondary | ICD-10-CM | POA: Diagnosis not present

## 2016-12-25 DIAGNOSIS — K219 Gastro-esophageal reflux disease without esophagitis: Secondary | ICD-10-CM | POA: Insufficient documentation

## 2016-12-25 DIAGNOSIS — C519 Malignant neoplasm of vulva, unspecified: Secondary | ICD-10-CM

## 2016-12-25 NOTE — Progress Notes (Signed)
Postoperative Follow-up Note: Gyn-Onc  Consult was initially requested by Dr. Sabra Heck for the evaluation of Patricia Gentry 61 y.o. female  CC:  Chief Complaint  Patient presents with  . Vaginal Cancer    Assessment/Plan:  Patricia Gentry  is a 61 y.o.  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  She is doing very very well postoperatively.   I am recommending close observation with serial exams every 3 months for 2 years and annual pap with obligate HPV testing annually. There is no benefit for increasing the frequency of paps unless dysplasia is identified during surveillance.  I will see her again in 53months.  HPI: Patricia Gentry is a very pleasant 61 year old G0 who is seen in consultation at the request of Dr Sabra Heck for Patricia Gentry.  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 She underwent an abdominal hysterectomy in 1984 for endometriosis. Approximate 4 years following this she began developing vaginal dysplasia. This predominantly low-grade and required minimal intervention. Her last interventional procedure was a vaginal laser for high-grade lesion in 2005. Following that she had a series of ASCUS Pap's with negative high-risk HPV. However in 09/02/2015 she had an ASCUS pap with high-risk HPV identified. This prompted a colposcopic evaluation on 09/15/2015 and biopsies of the anterior vagina. Dr. Sabra Heck notes that she sore a lesion on the anterior wall of the vaginal cuff. 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  The patient denies dyspareunia or vaginal bleeding or abnormal discharge. She is otherwise a very healthy woman who is treated only with estradiol orally and antianxiety medication.  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   On January  5th, 2017 she underwent a CO2 laser of the anterior vaginal cuff. The procedure was uncomplicated.  Pap in July 2017 showed ASCUS.  Colpo and directed biopsies of a slightly erythematous area at the cuff showed "fragments of reactive inflamed squamous epithelium, negative for dysplasia or malignancy" on 09/20/16.  On 11/02/16 she underwent colposcopic directed biopsies of the anterior and posterior vaginal cuff. The anterior cuff biopsies confirmed focal invasive SCC. There was no grossly visible lesion on inspection and no mass to palpate.  PET/CT on 11/19/16 showed no vaginal or pelvic mass and no distant metastatic disease .(an area seen in the left breast corresponded to a traumatic injury).  On 12/04/16 she underwent robotic assisted upper vaginectomy. The final pathology revealed a 79mm residual focus of squamous cell carcinoma (2.27mm on original biopsy). The margins were widely negative peripherally with a 18mm deep margin (this represents where the bladder was reflected from the upper vagina. There was a small focus on LVSI on the specimen.  Interval Hx: Since surgery she has done well with no complaints. No bleeding. Slight discharge. No pain.   Current Meds:  Outpatient Encounter Prescriptions as of 12/25/2016  Medication Sig  . atorvastatin (LIPITOR) 20 MG tablet Take 20 mg by mouth daily at 6 PM.   . Cholecalciferol (VITAMIN D3) 2000 units TABS Take 1 tablet by mouth daily.  . Cinnamon 500 MG TABS Take 500 mg by mouth daily.  Marland Kitchen escitalopram (LEXAPRO) 10 MG tablet TAKE ONE (1) TABLET EACH DAY (Patient taking differently: TAKE ONE (1) TABLET EACH DAY IN THE EVENING)  .  estradiol (ESTRACE) 0.5 MG tablet Take 1 tablet (0.5 mg total) by mouth daily.  Marland Kitchen ibuprofen (ADVIL,MOTRIN) 200 MG tablet Take 400 mg by mouth every 6 (six) hours as needed for mild pain.  . naproxen sodium (ANAPROX) 220 MG tablet Take 220 mg by mouth daily as needed (PAIN).  . Omega-3 Fatty Acids (FISH OIL) 1000 MG CAPS  Take 1,000 mg by mouth daily.  . ranitidine (ZANTAC) 150 MG tablet Take 150 mg by mouth 2 (two) times daily as needed (ACID REFLUX).   Marland Kitchen senna (SENOKOT) 8.6 MG TABS tablet Take 1 tablet (8.6 mg total) by mouth at bedtime.  . vitamin C (ASCORBIC ACID) 500 MG tablet Take 500 mg by mouth daily.  . [DISCONTINUED] oxyCODONE-acetaminophen (PERCOCET/ROXICET) 5-325 MG tablet Take 1-2 tablets by mouth every 4 (four) hours as needed (moderate to severe pain (when tolerating fluids)).   No facility-administered encounter medications on file as of 12/25/2016.     Allergy:  No Known Allergies  Social Hx:   Social History   Social History  . Marital status: Divorced    Spouse name: N/A  . Number of children: N/A  . Years of education: N/A   Occupational History  . Not on file.   Social History Main Topics  . Smoking status: Never Smoker  . Smokeless tobacco: Never Used  . Alcohol use No  . Drug use: No  . Sexual activity: Yes    Partners: Male    Birth control/ protection: Surgical     Comment: LAVH/BSO   Other Topics Concern  . Not on file   Social History Narrative  . No narrative on file    Past Surgical Hx:  Past Surgical History:  Procedure Laterality Date  . CERVICAL CONE BIOPSY  mid  1980's  . CO2 LASER APPLICATION N/A 02/15/6269   Procedure: CO2 LASER APPLICATION OF THE VAGINA;  Surgeon: Everitt Amber, MD;  Location: [facility];  Service: Gynecology;  Laterality: N/A;  . LAPAROSCOPIC ASSISTED VAGINAL HYSTERECTOMY  1984   and BSO  . LASER ABLATION VAGINAL DYSPLASIA  2005   high-grade   . ROBOTIC ASSISTED TOTAL HYSTERECTOMY N/A 12/04/2016   Procedure: ROBOTIC ASSISTED UPPER VAGINECTOMY;  Surgeon: Everitt Amber, MD;  Location: [facility];  Service: Gynecology;  Laterality: N/A;    Past Medical Hx:  Past Medical History:  Diagnosis Date  . Anxiety   . Complication of anesthesia    slow to wake  . Depression   . GERD (gastroesophageal reflux disease)   . High  risk HPV infection    per Pap 09-02-2015  . History of cervical dysplasia    CONIZATION 1980'S  . History of condyloma acuminatum    vulva--  2009  . History of vaginal dysplasia    VAIN 2--  LASER ABLATION 2005  . PONV (postoperative nausea and vomiting)   . VAIN III (vaginal intraepithelial neoplasia grade III)   . Wears glasses     Past Gynecological History:  G0, hysterectomy for endometriosis in 1984  Patient's last menstrual period was 10/15/1992.  Family Hx:  Family History  Problem Relation Age of Onset  . Hypertension Mother   . Heart disease Mother   . Hypertension Father   . Heart disease Father   . Hypertension Brother   . Heart disease Brother   . Stroke Brother   . Diabetes Brother     younger brother  . Osteoporosis Maternal Aunt     Review of Systems:  Constitutional  Feels well,    ENT Normal appearing ears and nares bilaterally Skin/Breast  No rash, sores, jaundice, itching, dryness Cardiovascular  No chest pain, shortness of breath, or edema  Pulmonary  No cough or wheeze.  Gastro Intestinal  No nausea, vomitting, or diarrhoea. No bright red blood per rectum, no abdominal pain, change in bowel movement, or constipation.  Genito Urinary  No frequency, urgency, dysuria, see HPI Musculo Skeletal  No myalgia, arthralgia, joint swelling or pain  Neurologic  No weakness, numbness, change in gait,  Psychology  No depression, anxiety, insomnia.   Vitals:  Blood pressure 133/87, pulse 94, temperature 98.2 F (36.8 C), temperature source Oral, resp. rate 20, weight 134 lb 1.6 oz (60.8 kg), last menstrual period 10/15/1992, SpO2 100 %.  Physical Exam: WD in NAD Neck  Supple NROM, without any enlargements.  Lymph Node Survey No cervical supraclavicular or inguinal adenopathy Cardiovascular  Pulse normal rate, regularity and rhythm. S1 and S2 normal.  Lungs  Clear to auscultation bilateraly, without wheezes/crackles/rhonchi. Good air movement.   Skin  No rash/lesions/breakdown  Psychiatry  Alert and oriented to person, place, and time  Abdomen  Normoactive bowel sounds, abdomen soft, non-tender and thin without evidence of hernia.  Back No CVA tenderness Genito Urinary  Vulva/vagina: Normal external female genitalia.  No lesions. No discharge or bleeding.  Bladder/urethra:  No lesions or masses, well supported bladder  Vagina: biopsy site on anterior vagina well healed (in close approximation (within 1 cm) of vaginal cuff.  Cervix: surgically absent  Uterus: surgically absent.  Adnexa: no palpable masses. Rectal  Good tone, no masses no cul de sac nodularity.  Extremities  No bilateral cyanosis, clubbing or edema.   15 minutes of direct face to face counseling time was spent with the patient. This included discussion about prognosis, therapy recommendations and postoperative side effects and are beyond the scope of routine postoperative care.    Donaciano Eva, MD  12/25/2016, 3:19 PM

## 2016-12-25 NOTE — Patient Instructions (Signed)
Continue avoiding heavy lifting until 1 month postop. You can resume full yoga at 6 weeks.  I will see you for follow-up in 3 months.

## 2017-03-27 ENCOUNTER — Ambulatory Visit: Payer: PRIVATE HEALTH INSURANCE | Attending: Gynecologic Oncology | Admitting: Gynecologic Oncology

## 2017-03-27 ENCOUNTER — Encounter: Payer: Self-pay | Admitting: Gynecologic Oncology

## 2017-03-27 VITALS — BP 137/92 | HR 80 | Temp 98.1°F | Resp 20 | Wt 131.2 lb

## 2017-03-27 DIAGNOSIS — Z79899 Other long term (current) drug therapy: Secondary | ICD-10-CM | POA: Diagnosis not present

## 2017-03-27 DIAGNOSIS — Z833 Family history of diabetes mellitus: Secondary | ICD-10-CM | POA: Diagnosis not present

## 2017-03-27 DIAGNOSIS — F329 Major depressive disorder, single episode, unspecified: Secondary | ICD-10-CM | POA: Diagnosis not present

## 2017-03-27 DIAGNOSIS — C52 Malignant neoplasm of vagina: Secondary | ICD-10-CM | POA: Diagnosis not present

## 2017-03-27 DIAGNOSIS — Z90710 Acquired absence of both cervix and uterus: Secondary | ICD-10-CM | POA: Diagnosis not present

## 2017-03-27 DIAGNOSIS — Z8249 Family history of ischemic heart disease and other diseases of the circulatory system: Secondary | ICD-10-CM | POA: Insufficient documentation

## 2017-03-27 DIAGNOSIS — N898 Other specified noninflammatory disorders of vagina: Secondary | ICD-10-CM | POA: Diagnosis not present

## 2017-03-27 DIAGNOSIS — Z9889 Other specified postprocedural states: Secondary | ICD-10-CM | POA: Diagnosis not present

## 2017-03-27 DIAGNOSIS — F419 Anxiety disorder, unspecified: Secondary | ICD-10-CM | POA: Insufficient documentation

## 2017-03-27 DIAGNOSIS — K219 Gastro-esophageal reflux disease without esophagitis: Secondary | ICD-10-CM | POA: Insufficient documentation

## 2017-03-27 DIAGNOSIS — Z823 Family history of stroke: Secondary | ICD-10-CM | POA: Insufficient documentation

## 2017-03-27 MED ORDER — ESTROGENS, CONJUGATED 0.625 MG/GM VA CREA: 1.0000 | TOPICAL_CREAM | VAGINAL | 12 refills | Status: DC

## 2017-03-27 NOTE — Progress Notes (Signed)
Follow-up Note: Gyn-Onc  Consult was initially requested by Dr. Sabra Heck for the evaluation of Patricia Gentry 61 y.o. female  CC:  Chief Complaint  Patient presents with  . Vaginal Cancer    Follow up    Assessment/Plan:  Patricia Gentry  is a 61 y.o.  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  She has an area of granulation tissue versus recurrence at the vaginal cuff. I will follow-up today's biopsy. I favor granulation tissue from vaginal cuff given her symptoms consistent with dehiscence with intercourse. There is no frank dehiscence now.   I am recommending vaginal premarin 3 times a week for 3 months. I will evaluate her in 3 months for healing.   Continue serial exams every 3 months for 2 years and annual pap with obligate HPV testing annually. There is no benefit for increasing the frequency of paps unless dysplasia is identified during surveillance.  HPI: Patricia Gentry is a very pleasant 61 year old G0 who is seen in consultation at the request of Dr Sabra Heck for [place].  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 She underwent an abdominal hysterectomy in 1984 for endometriosis. Approximate 4 years following this she began developing vaginal dysplasia. This predominantly low-grade and required minimal intervention. Her last interventional procedure was a vaginal laser for high-grade lesion in 2005. Following that she had a series of ASCUS Pap's with negative high-risk HPV. However in 09/02/2015 she had an ASCUS pap with high-risk HPV identified. This prompted a colposcopic evaluation on 09/15/2015 and biopsies of the anterior vagina. Dr. Sabra Heck notes that she sore a lesion on the anterior wall of the vaginal cuff. 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  The patient denies dyspareunia or vaginal bleeding or abnormal discharge. She is  otherwise a very healthy woman who is treated only with estradiol orally and antianxiety medication.  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   On January 5th, 2017 she underwent a CO2 laser of the anterior vaginal cuff. The procedure was uncomplicated.  Pap in July 2017 showed ASCUS.  Colpo and directed biopsies of a slightly erythematous area at the cuff showed "fragments of reactive inflamed squamous epithelium, negative for dysplasia or malignancy" on 09/20/16.  On 11/02/16 she underwent colposcopic directed biopsies of the anterior and posterior vaginal cuff. The anterior cuff biopsies confirmed focal invasive SCC. There was no grossly visible lesion on inspection and no mass to palpate.  PET/CT on 11/19/16 showed no vaginal or pelvic mass and no distant metastatic disease .(an area seen in the left breast corresponded to a traumatic injury).  On 12/04/16 she underwent robotic assisted upper vaginectomy. The final pathology revealed a 20mm residual focus of squamous cell carcinoma (2.52mm on original biopsy). The margins were widely negative peripherally with a 36mm deep margin (this represents where the bladder was reflected from the upper vagina. There was a small focus on LVSI on the specimen.  Interval Hx: 10 weeks post op she had intercourse which was painful. She felt a "pop" and had vaginal spotting for 2 days afterwards.  She has no other concerns.   Current Meds:  Outpatient Encounter Prescriptions as of 03/27/2017  Medication Sig  . atorvastatin (LIPITOR) 20 MG tablet Take 20 mg by  mouth daily at 6 PM.   . Cholecalciferol (VITAMIN D3) 2000 units TABS Take 1 tablet by mouth daily.  . Cinnamon 500 MG TABS Take 500 mg by mouth daily.  Marland Kitchen escitalopram (LEXAPRO) 10 MG tablet TAKE ONE (1) TABLET EACH DAY (Patient taking differently: TAKE ONE (1) TABLET EACH DAY IN THE EVENING)  . estradiol  (ESTRACE) 0.5 MG tablet Take 1 tablet (0.5 mg total) by mouth daily.  Marland Kitchen ibuprofen (ADVIL,MOTRIN) 200 MG tablet Take 400 mg by mouth every 6 (six) hours as needed for mild pain.  . naproxen sodium (ANAPROX) 220 MG tablet Take 220 mg by mouth daily as needed (PAIN).  . Omega-3 Fatty Acids (FISH OIL) 1000 MG CAPS Take 1,000 mg by mouth daily.  . ranitidine (ZANTAC) 150 MG tablet Take 150 mg by mouth 2 (two) times daily as needed (ACID REFLUX).   Marland Kitchen senna (SENOKOT) 8.6 MG TABS tablet Take 1 tablet (8.6 mg total) by mouth at bedtime.  . vitamin C (ASCORBIC ACID) 500 MG tablet Take 500 mg by mouth daily.   No facility-administered encounter medications on file as of 03/27/2017.     Allergy:  No Known Allergies  Social Hx:   Social History   Social History  . Marital status: Divorced    Spouse name: N/A  . Number of children: N/A  . Years of education: N/A   Occupational History  . Not on file.   Social History Main Topics  . Smoking status: Never Smoker  . Smokeless tobacco: Never Used  . Alcohol use No  . Drug use: No  . Sexual activity: Yes    Partners: Male    Birth control/ protection: Surgical     Comment: LAVH/BSO   Other Topics Concern  . Not on file   Social History Narrative  . No narrative on file    Past Surgical Hx:  Past Surgical History:  Procedure Laterality Date  . CERVICAL CONE BIOPSY  mid  1980's  . CO2 LASER APPLICATION N/A 01/21/7025   Procedure: CO2 LASER APPLICATION OF THE VAGINA;  Surgeon: Everitt Amber, MD;  Location: [facility];  Service: Gynecology;  Laterality: N/A;  . LAPAROSCOPIC ASSISTED VAGINAL HYSTERECTOMY  1984   and BSO  . LASER ABLATION VAGINAL DYSPLASIA  2005   high-grade   . ROBOTIC ASSISTED TOTAL HYSTERECTOMY N/A 12/04/2016   Procedure: ROBOTIC ASSISTED UPPER VAGINECTOMY;  Surgeon: Everitt Amber, MD;  Location: [facility];  Service: Gynecology;  Laterality: N/A;    Past Medical Hx:  Past Medical History:  Diagnosis Date  .  Anxiety   . Complication of anesthesia    slow to wake  . Depression   . GERD (gastroesophageal reflux disease)   . High risk HPV infection    per Pap 09-02-2015  . History of cervical dysplasia    CONIZATION 1980'S  . History of condyloma acuminatum    vulva--  2009  . History of vaginal dysplasia    VAIN 2--  LASER ABLATION 2005  . PONV (postoperative nausea and vomiting)   . VAIN III (vaginal intraepithelial neoplasia grade III)   . Wears glasses     Past Gynecological History:  G0, hysterectomy for endometriosis in 1984  Patient's last menstrual period was 10/15/1992.  Family Hx:  Family History  Problem Relation Age of Onset  . Hypertension Mother   . Heart disease Mother   . Hypertension Father   . Heart disease Father   . Hypertension Brother   .  Heart disease Brother   . Stroke Brother   . Diabetes Brother        younger brother  . Osteoporosis Maternal Aunt     Review of Systems:  Constitutional  Feels well,    ENT Normal appearing ears and nares bilaterally Skin/Breast  No rash, sores, jaundice, itching, dryness Cardiovascular  No chest pain, shortness of breath, or edema  Pulmonary  No cough or wheeze.  Gastro Intestinal  No nausea, vomitting, or diarrhoea. No bright red blood per rectum, no abdominal pain, change in bowel movement, or constipation.  Genito Urinary  No frequency, urgency, dysuria, see HPI Musculo Skeletal  No myalgia, arthralgia, joint swelling or pain  Neurologic  No weakness, numbness, change in gait,  Psychology  No depression, anxiety, insomnia.   Vitals:  Blood pressure (!) 137/92, pulse 80, temperature 98.1 F (36.7 C), resp. rate 20, weight 131 lb 3.2 oz (59.5 kg), last menstrual period 10/15/1992, SpO2 98 %.  Physical Exam: WD in NAD Neck  Supple NROM, without any enlargements.  Lymph Node Survey No cervical supraclavicular or inguinal adenopathy Cardiovascular  Pulse normal rate, regularity and rhythm. S1 and S2  normal.  Lungs  Clear to auscultation bilateraly, without wheezes/crackles/rhonchi. Good air movement.  Skin  No rash/lesions/breakdown  Psychiatry  Alert and oriented to person, place, and time  Abdomen  Normoactive bowel sounds, abdomen soft, non-tender and thin without evidence of hernia.  Back No CVA tenderness Genito Urinary  Vulva/vagina: Normal external female genitalia.  No lesions. No discharge or bleeding.  Bladder/urethra:  No lesions or masses, well supported bladder  Vagina: 2cm area of beefy red friable tissue (flush with vagina) at vaginal cuff. No nodularity or mass.   Cervix: surgically absent  Uterus: surgically absent.  Adnexa: no palpable masses. Rectal  Good tone, no masses no cul de sac nodularity.  Extremities  No bilateral cyanosis, clubbing or edema  Procedure note : vaginal biopsy Verbal consent obtained Time out performed tischler forcep used to grasp piece of vaginal granulation tissue. Hemostasis achieved with silver nitrate. EBL: minimal Specimens: vaginal cuff biopsy to pathology   Patricia Eva, MD  03/27/2017, 2:20 PM

## 2017-03-27 NOTE — Patient Instructions (Signed)
Plan to follow up in three months or sooner if needed.  Begin using premarin vaginal cream three times a week at bedtime.

## 2017-03-29 ENCOUNTER — Telehealth: Payer: Self-pay | Admitting: Gynecologic Oncology

## 2017-03-29 NOTE — Telephone Encounter (Signed)
Left message for patient with biopsy results.  Advised to please call for any questions or concerns.

## 2017-04-29 NOTE — Anesthesia Postprocedure Evaluation (Deleted)
Anesthesia Post Note  Patient: Patricia Gentry  Procedure(s) Performed: Procedure(s) (LRB): ROBOTIC ASSISTED UPPER VAGINECTOMY (N/A)     Anesthesia Post Evaluation  Last Vitals:  Vitals:   12/05/16 0557 12/05/16 1021  BP: 108/67 100/65  Pulse: 74 88  Resp: 16 16  Temp: 36.9 C 36.6 C    Last Pain:  Vitals:   12/05/16 1021  TempSrc: Oral  PainSc:                  Primrose Oler,E. Jester Klingberg

## 2017-04-29 NOTE — Addendum Note (Signed)
Addendum  created 04/29/17 1542 by Annye Asa, MD   Sign clinical note

## 2017-04-29 NOTE — Addendum Note (Signed)
Addendum  created 04/29/17 1620 by Annye Asa, MD   Delete clinical note, Sign clinical note

## 2017-06-19 ENCOUNTER — Ambulatory Visit: Payer: PRIVATE HEALTH INSURANCE | Attending: Gynecologic Oncology | Admitting: Gynecologic Oncology

## 2017-06-19 ENCOUNTER — Encounter: Payer: Self-pay | Admitting: Gynecologic Oncology

## 2017-06-19 VITALS — BP 147/92 | HR 86 | Temp 98.5°F | Resp 20 | Wt 136.0 lb

## 2017-06-19 DIAGNOSIS — Z79899 Other long term (current) drug therapy: Secondary | ICD-10-CM | POA: Diagnosis not present

## 2017-06-19 DIAGNOSIS — Z86008 Personal history of in-situ neoplasm of other site: Secondary | ICD-10-CM | POA: Diagnosis not present

## 2017-06-19 DIAGNOSIS — Z8544 Personal history of malignant neoplasm of other female genital organs: Secondary | ICD-10-CM | POA: Diagnosis present

## 2017-06-19 DIAGNOSIS — F329 Major depressive disorder, single episode, unspecified: Secondary | ICD-10-CM | POA: Diagnosis not present

## 2017-06-19 DIAGNOSIS — D072 Carcinoma in situ of vagina: Secondary | ICD-10-CM

## 2017-06-19 DIAGNOSIS — Z9889 Other specified postprocedural states: Secondary | ICD-10-CM | POA: Diagnosis not present

## 2017-06-19 DIAGNOSIS — Z90710 Acquired absence of both cervix and uterus: Secondary | ICD-10-CM | POA: Insufficient documentation

## 2017-06-19 DIAGNOSIS — C52 Malignant neoplasm of vagina: Secondary | ICD-10-CM

## 2017-06-19 NOTE — Progress Notes (Signed)
Follow-up Note: Gyn-Onc  Consult was initially requested by Dr. Sabra Heck for the evaluation of Patricia Gentry 61 y.o. female  CC:  Chief Complaint  Patient presents with  . VAIN III (vaginal intraepithelial neoplasia grade III)    Assessment/Plan:  Patricia Gentry  is a 61 y.o.  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  No evidence for recurrence.   I will evaluate her in 3 months for pap smear.   Continue serial exams every 3 months for 2 years and annual pap with obligate HPV testing annually. There is no benefit for increasing the frequency of paps unless dysplasia is identified during surveillance.  HPI: Patricia Gentry is a very pleasant 61 year old G0 who is seen in consultation at the request of Dr Sabra Heck for [place].  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 She underwent an abdominal hysterectomy in 1984 for endometriosis. Approximate 4 years following this she began developing vaginal dysplasia. This predominantly low-grade and required minimal intervention. Her last interventional procedure was a vaginal laser for high-grade lesion in 2005. Following that she had a series of ASCUS Pap's with negative high-risk HPV. However in 09/02/2015 she had an ASCUS pap with high-risk HPV identified. This prompted a colposcopic evaluation on 09/15/2015 and biopsies of the anterior vagina. Dr. Sabra Heck notes that she sore a lesion on the anterior wall of the vaginal cuff. 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  The patient denies dyspareunia or vaginal bleeding or abnormal discharge. She is otherwise a very healthy woman who is treated only with estradiol orally and antianxiety medication.  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   On January 5th, 2017  she underwent a CO2 laser of the anterior vaginal cuff. The procedure was uncomplicated.  Pap in July 2017 showed ASCUS.  Colpo and directed biopsies of a slightly erythematous area at the cuff showed "fragments of reactive inflamed squamous epithelium, negative for dysplasia or malignancy" on 09/20/16.  On 11/02/16 she underwent colposcopic directed biopsies of the anterior and posterior vaginal cuff. The anterior cuff biopsies confirmed focal invasive SCC. There was no grossly visible lesion on inspection and no mass to palpate.  PET/CT on 11/19/16 showed no vaginal or pelvic mass and no distant metastatic disease .(an area seen in the left breast corresponded to a traumatic injury).  On 12/04/16 she underwent robotic assisted upper vaginectomy. The final pathology revealed a 45mm residual focus of squamous cell carcinoma (2.6mm on original biopsy). The margins were widely negative peripherally with a 74mm deep margin (this represents where the bladder was reflected from the upper vagina. There was a small focus on LVSI on the specimen.  Interval Hx: 10 weeks post op she had intercourse which was painful. She felt a "pop" and had vaginal spotting for 2 days afterwards. Granulation tissue was seen (and biopsy confirmed) on exam.  She has had no bleeding issues since that time.   Current Meds:  Outpatient Encounter Prescriptions as of 06/19/2017  Medication Sig  . atorvastatin (LIPITOR) 20 MG tablet Take 20 mg by mouth daily at 6 PM.   . Cholecalciferol (VITAMIN D3) 2000 units TABS Take 1 tablet by mouth daily.  . Cinnamon 500 MG TABS Take 500 mg by mouth daily.  Marland Kitchen  conjugated estrogens (PREMARIN) vaginal cream Place 1 Applicatorful vaginally 3 (three) times a week. Apply at night.  . escitalopram (LEXAPRO) 10 MG tablet TAKE ONE (1) TABLET EACH DAY (Patient taking differently: TAKE ONE (1) TABLET EACH DAY IN THE EVENING)  . estradiol (ESTRACE) 0.5 MG tablet Take 1 tablet (0.5 mg total) by mouth  daily.  Marland Kitchen ibuprofen (ADVIL,MOTRIN) 200 MG tablet Take 400 mg by mouth every 6 (six) hours as needed for mild pain.  . naproxen sodium (ANAPROX) 220 MG tablet Take 220 mg by mouth daily as needed (PAIN).  . Omega-3 Fatty Acids (FISH OIL) 1000 MG CAPS Take 1,000 mg by mouth daily.  . ranitidine (ZANTAC) 150 MG tablet Take 150 mg by mouth 2 (two) times daily as needed (ACID REFLUX).   Marland Kitchen senna (SENOKOT) 8.6 MG TABS tablet Take 1 tablet (8.6 mg total) by mouth at bedtime.  . vitamin C (ASCORBIC ACID) 500 MG tablet Take 500 mg by mouth daily.   No facility-administered encounter medications on file as of 06/19/2017.     Allergy:  No Known Allergies  Social Hx:   Social History   Social History  . Marital status: Divorced    Spouse name: N/A  . Number of children: N/A  . Years of education: N/A   Occupational History  . Not on file.   Social History Main Topics  . Smoking status: Never Smoker  . Smokeless tobacco: Never Used  . Alcohol use No  . Drug use: No  . Sexual activity: Yes    Partners: Male    Birth control/ protection: Surgical     Comment: LAVH/BSO   Other Topics Concern  . Not on file   Social History Narrative  . No narrative on file    Past Surgical Hx:  Past Surgical History:  Procedure Laterality Date  . CERVICAL CONE BIOPSY  mid  1980's  . CO2 LASER APPLICATION N/A 05/19/4626   Procedure: CO2 LASER APPLICATION OF THE VAGINA;  Surgeon: Everitt Amber, MD;  Location[facility];  Service: Gynecology;  Laterality: N/A;  . LAPAROSCOPIC ASSISTED VAGINAL HYSTERECTOMY  1984   and BSO  . LASER ABLATION VAGINAL DYSPLASIA  2005   high-grade   . ROBOTIC ASSISTED TOTAL HYSTERECTOMY N/A 12/04/2016   Procedure: ROBOTIC ASSISTED UPPER VAGINECTOMY;  Surgeon: Everitt Amber, MD;  Location: [facility];  Service: Gynecology;  Laterality: N/A;    Past Medical Hx:  Past Medical History:  Diagnosis Date  . Anxiety   . Complication of anesthesia    slow to wake  .  Depression   . GERD (gastroesophageal reflux disease)   . High risk HPV infection    per Pap 09-02-2015  . History of cervical dysplasia    CONIZATION 1980'S  . History of condyloma acuminatum    vulva--  2009  . History of vaginal dysplasia    VAIN 2--  LASER ABLATION 2005  . PONV (postoperative nausea and vomiting)   . VAIN III (vaginal intraepithelial neoplasia grade III)   . Wears glasses     Past Gynecological History:  G0, hysterectomy for endometriosis in 1984  Patient's last menstrual period was 10/15/1992.  Family Hx:  Family History  Problem Relation Age of Onset  . Hypertension Mother   . Heart disease Mother   . Hypertension Father   . Heart disease Father   . Hypertension Brother   . Heart disease Brother   . Stroke Brother   . Diabetes Brother  younger brother  . Osteoporosis Maternal Aunt     Review of Systems:  Constitutional  Feels well,    ENT Normal appearing ears and nares bilaterally Skin/Breast  No rash, sores, jaundice, itching, dryness Cardiovascular  No chest pain, shortness of breath, or edema  Pulmonary  No cough or wheeze.  Gastro Intestinal  No nausea, vomitting, or diarrhoea. No bright red blood per rectum, no abdominal pain, change in bowel movement, or constipation.  Genito Urinary  No frequency, urgency, dysuria, see HPI Musculo Skeletal  No myalgia, arthralgia, joint swelling or pain  Neurologic  No weakness, numbness, change in gait,  Psychology  No depression, anxiety, insomnia.   Vitals:  Blood pressure (!) 147/92, pulse 86, temperature 98.5 F (36.9 C), temperature source Oral, resp. rate 20, weight 136 lb (61.7 kg), last menstrual period 10/15/1992, SpO2 97 %.  Physical Exam: WD in NAD Neck  Supple NROM, without any enlargements.  Lymph Node Survey No cervical supraclavicular or inguinal adenopathy Cardiovascular  Pulse normal rate, regularity and rhythm. S1 and S2 normal.  Lungs  Clear to auscultation  bilateraly, without wheezes/crackles/rhonchi. Good air movement.  Skin  No rash/lesions/breakdown  Psychiatry  Alert and oriented to person, place, and time  Abdomen  Normoactive bowel sounds, abdomen soft, non-tender and thin without evidence of hernia.  Back No CVA tenderness Genito Urinary  Vulva/vagina: Normal external female genitalia.  No lesions. No discharge or bleeding.  Bladder/urethra:  No lesions or masses, well supported bladder  Vagina: slight hyperemia at vaginal cuff. No nodularity or mass.   Cervix: surgically absent  Uterus: surgically absent.  Adnexa: no palpable masses. Rectal  Good tone, no masses no cul de sac nodularity.  Extremities  No bilateral cyanosis, clubbing or edema    Donaciano Eva, MD  06/19/2017, 2:05 PM

## 2017-06-19 NOTE — Patient Instructions (Signed)
Please notify Dr Justiss Gerbino at phone number [phone] if you notice vaginal bleeding, new pelvic or abdominal pains, bloating, feeling full easy, or a change in bladder or bowel function.   

## 2017-06-24 ENCOUNTER — Ambulatory Visit: Payer: PRIVATE HEALTH INSURANCE | Admitting: Gynecologic Oncology

## 2017-09-13 ENCOUNTER — Other Ambulatory Visit: Payer: Self-pay | Admitting: Gynecologic Oncology

## 2017-09-13 DIAGNOSIS — Z1231 Encounter for screening mammogram for malignant neoplasm of breast: Secondary | ICD-10-CM

## 2017-09-18 ENCOUNTER — Other Ambulatory Visit (HOSPITAL_COMMUNITY)
Admission: RE | Admit: 2017-09-18 | Discharge: 2017-09-18 | Disposition: A | Discharge status: Home / Self Care | Payer: PRIVATE HEALTH INSURANCE | Source: Ambulatory Visit | Attending: Gynecologic Oncology | Admitting: Gynecologic Oncology

## 2017-09-18 ENCOUNTER — Encounter: Payer: Self-pay | Admitting: Gynecologic Oncology

## 2017-09-18 ENCOUNTER — Ambulatory Visit: Payer: PRIVATE HEALTH INSURANCE | Attending: Gynecologic Oncology | Admitting: Gynecologic Oncology

## 2017-09-18 VITALS — BP 118/69 | HR 75 | Temp 97.7°F | Resp 18 | Ht 64.0 in | Wt 136.2 lb

## 2017-09-18 DIAGNOSIS — Z9889 Other specified postprocedural states: Secondary | ICD-10-CM | POA: Diagnosis not present

## 2017-09-18 DIAGNOSIS — Z79899 Other long term (current) drug therapy: Secondary | ICD-10-CM | POA: Diagnosis not present

## 2017-09-18 DIAGNOSIS — Z90710 Acquired absence of both cervix and uterus: Secondary | ICD-10-CM | POA: Diagnosis not present

## 2017-09-18 DIAGNOSIS — F329 Major depressive disorder, single episode, unspecified: Secondary | ICD-10-CM | POA: Insufficient documentation

## 2017-09-18 DIAGNOSIS — Z8544 Personal history of malignant neoplasm of other female genital organs: Secondary | ICD-10-CM | POA: Diagnosis not present

## 2017-09-18 DIAGNOSIS — C52 Malignant neoplasm of vagina: Secondary | ICD-10-CM | POA: Diagnosis present

## 2017-09-18 DIAGNOSIS — K219 Gastro-esophageal reflux disease without esophagitis: Secondary | ICD-10-CM | POA: Diagnosis not present

## 2017-09-18 DIAGNOSIS — F419 Anxiety disorder, unspecified: Secondary | ICD-10-CM | POA: Insufficient documentation

## 2017-09-18 DIAGNOSIS — Z79890 Hormone replacement therapy (postmenopausal): Secondary | ICD-10-CM | POA: Diagnosis not present

## 2017-09-18 NOTE — Progress Notes (Signed)
Follow-up Note: Gyn-Onc  Consult was initially requested by Dr. Sabra Heck for the evaluation of Patricia Gentry 61 y.o. female  CC:  Chief Complaint  Patient presents with  . Vaginal cancer [facility])    Assessment/Plan:  Patricia Gentry  is a 61 y.o.  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  No evidence for recurrence. Will follow-up today's pap result for occult dysplasia or high risk HPV infection.  I will evaluate her in 3 months.   Next pap smear due December, 2019.  Continue serial exams every 3 months for 2 years and annual pap with obligate HPV testing annually. There is no benefit for increasing the frequency of paps unless dysplasia is identified during surveillance.  HPI: Patricia Gentry is a very pleasant 61 year old G0 who is seen in consultation at the request of Dr Sabra Heck for [place].  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 She underwent an abdominal hysterectomy in 1984 for endometriosis. Approximate 4 years following this she began developing vaginal dysplasia. This predominantly low-grade and required minimal intervention. Her last interventional procedure was a vaginal laser for high-grade lesion in 2005. Following that she had a series of ASCUS Pap's with negative high-risk HPV. However in 09/02/2015 she had an ASCUS pap with high-risk HPV identified. This prompted a colposcopic evaluation on 09/15/2015 and biopsies of the anterior vagina. Dr. Sabra Heck notes that she sore a lesion on the anterior wall of the vaginal cuff. 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  The patient denies dyspareunia or vaginal bleeding or abnormal discharge. She is otherwise a very healthy woman who is treated only with estradiol orally and antianxiety medication.  Repeat biopsies on December 19th, 2016 showed VAIN III only (no invasion) and she was  recommended to undergo vaginal CO2 laser ablation.   On January 5th, 2017 she underwent a CO2 laser of the anterior vaginal cuff. The procedure was uncomplicated.  Pap in July 2017 showed ASCUS.  Colpo and directed biopsies of a slightly erythematous area at the cuff showed "fragments of reactive inflamed squamous epithelium, negative for dysplasia or malignancy" on 09/20/16.  On 11/02/16 she underwent colposcopic directed biopsies of the anterior and posterior vaginal cuff. The anterior cuff biopsies confirmed focal invasive SCC. There was no grossly visible lesion on inspection and no mass to palpate.  PET/CT on 11/19/16 showed no vaginal or pelvic mass and no distant metastatic disease .(an area seen in the left breast corresponded to a traumatic injury).  On 12/04/16 she underwent robotic assisted upper vaginectomy. The final pathology revealed a 52mm residual focus of squamous cell carcinoma (2.88mm on original biopsy). The margins were widely negative peripherally with a 40mm deep margin (this represents where the bladder was reflected from the upper vagina). There was a small focus of LVSI on the specimen.  Interval Hx: 10 weeks post op she had intercourse which was painful. She felt a "pop" and had vaginal spotting for 2 days afterwards. Granulation tissue was seen (and biopsy confirmed) on exam.  She has had no bleeding issues since that time.   Current Meds:  Outpatient Encounter Medications as of 09/18/2017  Medication Sig  . Cholecalciferol (VITAMIN D3) 2000 units TABS Take 1 tablet by mouth daily.  . Cinnamon 500 MG TABS Take 500 mg by mouth daily.  Marland Kitchen escitalopram (LEXAPRO) 10 MG  tablet TAKE ONE (1) TABLET EACH DAY (Patient taking differently: TAKE ONE (1) TABLET EACH DAY IN THE EVENING)  . estradiol (ESTRACE) 0.5 MG tablet Take 1 tablet (0.5 mg total) by mouth daily.  Marland Kitchen ibuprofen (ADVIL,MOTRIN) 200 MG tablet Take 400 mg by mouth every 6 (six) hours as needed for mild pain.  .  naproxen sodium (ANAPROX) 220 MG tablet Take 220 mg by mouth daily as needed (PAIN).  . Omega-3 Fatty Acids (FISH OIL) 1000 MG CAPS Take 1,000 mg by mouth daily.  . ranitidine (ZANTAC) 150 MG tablet Take 150 mg by mouth 2 (two) times daily as needed (ACID REFLUX).   Marland Kitchen senna (SENOKOT) 8.6 MG TABS tablet Take 1 tablet (8.6 mg total) by mouth at bedtime.  . vitamin C (ASCORBIC ACID) 500 MG tablet Take 500 mg by mouth daily.  . [DISCONTINUED] atorvastatin (LIPITOR) 20 MG tablet Take 20 mg by mouth daily at 6 PM.   . [DISCONTINUED] conjugated estrogens (PREMARIN) vaginal cream Place 1 Applicatorful vaginally 3 (three) times a week. Apply at night.   No facility-administered encounter medications on file as of 09/18/2017.     Allergy:  No Known Allergies  Social Hx:   Social History   Socioeconomic History  . Marital status: Divorced    Spouse name: Not on file  . Number of children: Not on file  . Years of education: Not on file  . Highest education level: Not on file  Social Needs  . Financial resource strain: Not on file  . Food insecurity - worry: Not on file  . Food insecurity - inability: Not on file  . Transportation needs - medical: Not on file  . Transportation needs - non-medical: Not on file  Occupational History  . Not on file  Tobacco Use  . Smoking status: Never Smoker  . Smokeless tobacco: Never Used  Substance and Sexual Activity  . Alcohol use: No    Alcohol/week: 0.0 oz  . Drug use: No  . Sexual activity: Yes    Partners: Male    Birth control/protection: Surgical    Comment: LAVH/BSO  Other Topics Concern  . Not on file  Social History Narrative  . Not on file    Past Surgical Hx:  Past Surgical History:  Procedure Laterality Date  . CERVICAL CONE BIOPSY  mid  1980's  . CO2 LASER APPLICATION N/A 12/15/3555   Procedure: CO2 LASER APPLICATION OF THE VAGINA;  Surgeon: Everitt Amber, MD;  Location: [facility];  Service: Gynecology;  Laterality:  N/A;  . LAPAROSCOPIC ASSISTED VAGINAL HYSTERECTOMY  1984   and BSO  . LASER ABLATION VAGINAL DYSPLASIA  2005   high-grade   . ROBOTIC ASSISTED TOTAL HYSTERECTOMY N/A 12/04/2016   Procedure: ROBOTIC ASSISTED UPPER VAGINECTOMY;  Surgeon: Everitt Amber, MD;  Location: [facility];  Service: Gynecology;  Laterality: N/A;    Past Medical Hx:  Past Medical History:  Diagnosis Date  . Anxiety   . Complication of anesthesia    slow to wake  . Depression   . GERD (gastroesophageal reflux disease)   . High risk HPV infection    per Pap 09-02-2015  . History of cervical dysplasia    CONIZATION 1980'S  . History of condyloma acuminatum    vulva--  2009  . History of vaginal dysplasia    VAIN 2--  LASER ABLATION 2005  . PONV (postoperative nausea and vomiting)   . VAIN III (vaginal intraepithelial neoplasia grade III)   .  Wears glasses     Past Gynecological History:  G0, hysterectomy for endometriosis in 1984  Patient's last menstrual period was 10/15/1992.  Family Hx:  Family History  Problem Relation Age of Onset  . Hypertension Mother   . Heart disease Mother   . Hypertension Father   . Heart disease Father   . Hypertension Brother   . Heart disease Brother   . Stroke Brother   . Diabetes Brother        younger brother  . Osteoporosis Maternal Aunt     Review of Systems:  Constitutional  Feels well,    ENT Normal appearing ears and nares bilaterally Skin/Breast  No rash, sores, jaundice, itching, dryness Cardiovascular  No chest pain, shortness of breath, or edema  Pulmonary  No cough or wheeze.  Gastro Intestinal  No nausea, vomitting, or diarrhoea. No bright red blood per rectum, no abdominal pain, change in bowel movement, or constipation.  Genito Urinary  No frequency, urgency, dysuria, see HPI Musculo Skeletal  No myalgia, arthralgia, joint swelling or pain  Neurologic  No weakness, numbness, change in gait,  Psychology  No depression, anxiety, insomnia.    Vitals:  Blood pressure 118/69, pulse 75, temperature 97.7 F (36.5 C), temperature source Oral, resp. rate 18, height 5\' 4"  (1.626 m), weight 136 lb 3.2 oz (61.8 kg), last menstrual period 10/15/1992, SpO2 99 %.  Physical Exam: WD in NAD Neck  Supple NROM, without any enlargements.  Lymph Node Survey No cervical supraclavicular or inguinal adenopathy Cardiovascular  Pulse normal rate, regularity and rhythm. S1 and S2 normal.  Lungs  Clear to auscultation bilateraly, without wheezes/crackles/rhonchi. Good air movement.  Skin  No rash/lesions/breakdown  Psychiatry  Alert and oriented to person, place, and time  Abdomen  Normoactive bowel sounds, abdomen soft, non-tender and thin without evidence of hernia.  Back No CVA tenderness Genito Urinary  Vulva/vagina: Normal external female genitalia.  No lesions. No discharge or bleeding.  Bladder/urethra:  No lesions or masses, well supported bladder  Vagina: slight hyperemia at vaginal cuff. No nodularity or mass. Pap taken  Cervix: surgically absent  Uterus: surgically absent.  Adnexa: no palpable masses. Rectal  Good tone, no masses no cul de sac nodularity.  Extremities  No bilateral cyanosis, clubbing or edema    Patricia Eva, MD  09/18/2017, 2:49 PM

## 2017-09-18 NOTE — Patient Instructions (Signed)
Please notify Dr Denman George at phone number [phone] if you notice vaginal bleeding, new pelvic or abdominal pains, bloating, feeling full easy, or a change in bladder or bowel function.   Please return to see Dr Denman George as scheduled in March, 2019

## 2017-09-20 LAB — CYTOLOGY - PAP
DIAGNOSIS: NEGATIVE
HPV: NOT DETECTED

## 2017-09-24 ENCOUNTER — Telehealth: Payer: Self-pay | Admitting: Gynecologic Oncology

## 2017-09-24 NOTE — Telephone Encounter (Signed)
Left message for patient with Pap results.  Advised to call for any needs

## 2017-10-16 ENCOUNTER — Ambulatory Visit: Payer: PRIVATE HEALTH INSURANCE

## 2017-10-17 ENCOUNTER — Other Ambulatory Visit: Payer: Self-pay | Admitting: Obstetrics & Gynecology

## 2017-10-17 NOTE — Telephone Encounter (Signed)
Medication refill request: Estradiol  Last AEX:  09/02/15 SM  Next AEX: not currently scheduled, recently seen by Dr. Denman George 09/18/17  Last MMG (if hormonal medication request): 03/16/15 BIRADS 1 negative, has scheduled for 11/05/17  Refill authorized: 10/11/16 #90, 4 RF. Today, please advise.

## 2017-10-18 NOTE — Telephone Encounter (Signed)
Duplicate refill request.

## 2017-11-05 ENCOUNTER — Ambulatory Visit
Admission: RE | Admit: 2017-11-05 | Discharge: 2017-11-05 | Disposition: A | Discharge status: Home / Self Care | Payer: PRIVATE HEALTH INSURANCE | Source: Ambulatory Visit | Attending: Gynecologic Oncology | Admitting: Gynecologic Oncology

## 2017-11-05 DIAGNOSIS — Z1231 Encounter for screening mammogram for malignant neoplasm of breast: Secondary | ICD-10-CM

## 2017-11-06 ENCOUNTER — Other Ambulatory Visit: Payer: Self-pay | Admitting: Obstetrics & Gynecology

## 2017-11-06 NOTE — Telephone Encounter (Signed)
Medication refill request: Lexapro  Last AEX:  09-02-15 Next AEX: not scheduled ( did recently see Dr. Denman George)  Last MMG (if hormonal medication request): 03-16-15 WNL scheduled for 122-19 Refill authorized: please advise

## 2017-12-17 ENCOUNTER — Telehealth: Payer: Self-pay | Admitting: *Deleted

## 2017-12-17 NOTE — Telephone Encounter (Signed)
Patient called and rescheduled her appt from March 7th to April 19th . Patient is sick. Patient not having an issues or concerns at this time. Advised her to call before her appt if she develops any issues or concerns

## 2017-12-19 ENCOUNTER — Ambulatory Visit: Payer: PRIVATE HEALTH INSURANCE | Admitting: Gynecologic Oncology

## 2018-01-31 ENCOUNTER — Inpatient Hospital Stay: Payer: 59 | Attending: Gynecologic Oncology | Admitting: Gynecologic Oncology

## 2018-01-31 ENCOUNTER — Encounter: Payer: Self-pay | Admitting: Gynecologic Oncology

## 2018-01-31 VITALS — BP 122/83 | HR 77 | Temp 98.3°F | Resp 20 | Ht 64.0 in | Wt 138.5 lb

## 2018-01-31 DIAGNOSIS — Z90710 Acquired absence of both cervix and uterus: Secondary | ICD-10-CM | POA: Diagnosis not present

## 2018-01-31 DIAGNOSIS — R05 Cough: Secondary | ICD-10-CM | POA: Insufficient documentation

## 2018-01-31 DIAGNOSIS — C52 Malignant neoplasm of vagina: Secondary | ICD-10-CM | POA: Insufficient documentation

## 2018-01-31 DIAGNOSIS — R059 Cough, unspecified: Secondary | ICD-10-CM

## 2018-01-31 NOTE — Patient Instructions (Signed)
You can arrive any time to [facility] to have your chest xray.  Plan on following up in three months or sooner if needed.  Please call for any needs or concerns.

## 2018-01-31 NOTE — Progress Notes (Signed)
Follow-up Note: Gyn-Onc  Consult was initially requested by Dr. Sabra Heck for the evaluation of [name] 62 y.o. female  CC:  Chief Complaint  Patient presents with  . Vaginal cancer [facility])    Assessment/Plan:  Ms. TEREASE MARCOTTE  is a 62 y.o.  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  No evidence for recurrence. Will follow-up today's pap result for occult dysplasia or high risk HPV infection.  Persistent cough- have ordered CXR  I will evaluate her in 3 months.   Next pap smear due December, 2019.  Continue serial exams every 3 months for 2 years and annual pap with obligate HPV testing annually. There is no benefit for increasing the frequency of paps unless dysplasia is identified during surveillance.  HPI: Itzamara Casas is a very pleasant 62 year old G0 who is seen in consultation at the request of Dr Sabra Heck for [place].  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 She underwent an abdominal hysterectomy in 1984 for endometriosis. Approximate 4 years following this she began developing vaginal dysplasia. This predominantly low-grade and required minimal intervention. Her last interventional procedure was a vaginal laser for high-grade lesion in 2005. Following that she had a series of ASCUS Pap's with negative high-risk HPV. However in 09/02/2015 she had an ASCUS pap with high-risk HPV identified. This prompted a colposcopic evaluation on 09/15/2015 and biopsies of the anterior vagina. Dr. Sabra Heck notes that she sore a lesion on the anterior wall of the vaginal cuff. 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  The patient denies dyspareunia or vaginal bleeding or abnormal discharge. She is otherwise a very healthy woman who is treated only with estradiol orally and antianxiety medication.  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   On January 5th, 2017 she underwent a CO2 laser of the anterior vaginal cuff. The procedure was uncomplicated.  Pap in July 2017 showed ASCUS.  Colpo and directed biopsies of a slightly erythematous area at the cuff showed "fragments of reactive inflamed squamous epithelium, negative for dysplasia or malignancy" on 09/20/16.  On 11/02/16 she underwent colposcopic directed biopsies of the anterior and posterior vaginal cuff. The anterior cuff biopsies confirmed focal invasive SCC. There was no grossly visible lesion on inspection and no mass to palpate.  PET/CT on 11/19/16 showed no vaginal or pelvic mass and no distant metastatic disease .(an area seen in the left breast corresponded to a traumatic injury).  On 12/04/16 she underwent robotic assisted upper vaginectomy. The final pathology revealed a 28mm residual focus of squamous cell carcinoma (2.42mm on original biopsy). The margins were widely negative peripherally with a 69mm deep margin (this represents where the bladder was reflected from the upper vagina). There was a small focus of LVSI on the specimen.  Interval Hx: 10 weeks post op she had intercourse which was painful. She felt a "pop" and had vaginal spotting for 2 days afterwards. Granulation tissue was seen (and biopsy confirmed) on exam.  She has had no bleeding issues since that time. Pap in December, 2018 was benign and HPV negative.   She developed a sinus infection in February, 2019 with persistent cough unresolved with steroids and antibiotics.    Current Meds:  Outpatient Encounter Medications as of 01/31/2018  Medication Sig  . Cholecalciferol (VITAMIN D3) 2000 units TABS Take 1 tablet by mouth daily.  . Cinnamon 500 MG TABS Take 500 mg by mouth daily.  Marland Kitchen escitalopram (LEXAPRO) 10 MG tablet TAKE ONE (1) TABLET EACH DAY  . estradiol (ESTRACE) 0.5 MG tablet TAKE ONE (1) TABLET EACH DAY   . ibuprofen (ADVIL,MOTRIN) 200 MG tablet Take 400 mg by mouth every 6 (six) hours as needed for mild pain.  . naproxen sodium (ANAPROX) 220 MG tablet Take 220 mg by mouth daily as needed (PAIN).  . Omega-3 Fatty Acids (FISH OIL) 1000 MG CAPS Take 1,000 mg by mouth daily.  . ranitidine (ZANTAC) 150 MG tablet Take 150 mg by mouth 2 (two) times daily as needed (ACID REFLUX).   Marland Kitchen senna (SENOKOT) 8.6 MG TABS tablet Take 1 tablet (8.6 mg total) by mouth at bedtime.  . vitamin C (ASCORBIC ACID) 500 MG tablet Take 500 mg by mouth daily.   No facility-administered encounter medications on file as of 01/31/2018.     Allergy:  No Known Allergies  Social Hx:   Social History   Socioeconomic History  . Marital status: Divorced    Spouse name: Not on file  . Number of children: Not on file  . Years of education: Not on file  . Highest education level: Not on file  Occupational History  . Not on file  Social Needs  . Financial resource strain: Not on file  . Food insecurity:    Worry: Not on file    Inability: Not on file  . Transportation needs:    Medical: Not on file    Non-medical: Not on file  Tobacco Use  . Smoking status: Never Smoker  . Smokeless tobacco: Never Used  Substance and Sexual Activity  . Alcohol use: No    Alcohol/week: 0.0 oz  . Drug use: No  . Sexual activity: Yes    Partners: Male    Birth control/protection: Surgical    Comment: LAVH/BSO  Lifestyle  . Physical activity:    Days per week: Not on file    Minutes per session: Not on file  . Stress: Not on file  Relationships  . Social connections:    Talks on phone: Not on file    Gets together: Not on file    Attends religious service: Not on file    Active member of club or organization: Not on file    Attends meetings of clubs or organizations: Not on file    Relationship status: Not on file  . Intimate partner violence:    Fear of current or ex partner: Not on file    Emotionally abused: Not on  file    Physically abused: Not on file    Forced sexual activity: Not on file  Other Topics Concern  . Not on file  Social History Narrative  . Not on file    Past Surgical Hx:  Past Surgical History:  Procedure Laterality Date  . BREAST BIOPSY    . CERVICAL CONE BIOPSY  mid  1980's  . CO2 LASER APPLICATION N/A 01/17/8184   Procedure: CO2 LASER APPLICATION OF THE VAGINA;  Surgeon: Everitt Amber, MD;  Location: [facility];  Service: Gynecology;  Laterality: N/A;  . LAPAROSCOPIC ASSISTED VAGINAL HYSTERECTOMY  1984   and BSO  . LASER ABLATION VAGINAL DYSPLASIA  2005   high-grade   . ROBOTIC ASSISTED TOTAL HYSTERECTOMY N/A 12/04/2016   Procedure: ROBOTIC ASSISTED UPPER VAGINECTOMY;  Surgeon: Everitt Amber, MD;  Location: [facility];  Service: Gynecology;  Laterality: N/A;    Past Medical Hx:  Past Medical History:  Diagnosis Date  . Anxiety   . Complication of anesthesia    slow to wake  . Depression   . GERD (gastroesophageal reflux disease)   . High risk HPV infection    per Pap 09-02-2015  . History of cervical dysplasia    CONIZATION 1980'S  . History of condyloma acuminatum    vulva--  2009  . History of vaginal dysplasia    VAIN 2--  LASER ABLATION 2005  . PONV (postoperative nausea and vomiting)   . VAIN III (vaginal intraepithelial neoplasia grade III)   . Wears glasses     Past Gynecological History:  G0, hysterectomy for endometriosis in 1984  Patient's last menstrual period was 10/15/1992.  Family Hx:  Family History  Problem Relation Age of Onset  . Hypertension Mother   . Heart disease Mother   . Hypertension Father   . Heart disease Father   . Hypertension Brother   . Heart disease Brother   . Stroke Brother   . Diabetes Brother        younger brother  . Osteoporosis Maternal Aunt     Review of Systems:  Constitutional  Feels well,    ENT Normal appearing ears and nares bilaterally Skin/Breast  No rash, sores, jaundice, itching,  dryness Cardiovascular  No chest pain, shortness of breath, or edema  Pulmonary  + cough.  Gastro Intestinal  No nausea, vomitting, or diarrhoea. No bright red blood per rectum, no abdominal pain, change in bowel movement, or constipation.  Genito Urinary  No frequency, urgency, dysuria, see HPI Musculo Skeletal  No myalgia, arthralgia, joint swelling or pain  Neurologic  No weakness, numbness, change in gait,  Psychology  No depression, anxiety, insomnia.   Vitals:  Blood pressure 122/83, pulse 77, temperature 98.3 F (36.8 C), temperature source Oral, resp. rate 20, height 5\' 4"  (1.626 m), weight 138 lb 8 oz (62.8 kg), last menstrual period 10/15/1992, SpO2 98 %.  Physical Exam: WD in NAD Neck  Supple NROM, without any enlargements.  Lymph Node Survey No cervical supraclavicular or inguinal adenopathy Cardiovascular  Pulse normal rate, regularity and rhythm. S1 and S2 normal.  Lungs  Clear to auscultation bilateraly, without wheezes/crackles/rhonchi. Good air movement.  Skin  No rash/lesions/breakdown  Psychiatry  Alert and oriented to person, place, and time  Abdomen  Normoactive bowel sounds, abdomen soft, non-tender and thin without evidence of hernia.  Back No CVA tenderness Genito Urinary  Vulva/vagina: Normal external female genitalia.  No lesions. No discharge or bleeding.  Bladder/urethra:  No lesions or masses, well supported bladder  Vagina: slight hyperemia at vaginal cuff. No nodularity or mass. Pap taken  Cervix: surgically absent  Uterus: surgically absent.  Adnexa: no palpable masses. Rectal  Good tone, no masses no cul de sac nodularity.  Extremities  No bilateral cyanosis, clubbing or edema    Thereasa Solo, MD  01/31/2018, 4:18 PM

## 2018-02-07 ENCOUNTER — Ambulatory Visit (HOSPITAL_COMMUNITY)
Admission: RE | Admit: 2018-02-07 | Discharge: 2018-02-07 | Disposition: A | Discharge status: Home / Self Care | Payer: 59 | Source: Ambulatory Visit | Attending: Gynecologic Oncology | Admitting: Gynecologic Oncology

## 2018-02-07 DIAGNOSIS — C52 Malignant neoplasm of vagina: Secondary | ICD-10-CM | POA: Insufficient documentation

## 2018-02-07 DIAGNOSIS — J984 Other disorders of lung: Secondary | ICD-10-CM | POA: Diagnosis not present

## 2018-02-07 DIAGNOSIS — R059 Cough, unspecified: Secondary | ICD-10-CM

## 2018-02-07 DIAGNOSIS — R05 Cough: Secondary | ICD-10-CM | POA: Diagnosis present

## 2018-02-08 IMAGING — PT NM PET TUM IMG INITIAL (PI) SKULL BASE T - THIGH
8 series · 25 of 25 positions shown · non-contrast
Comparison: None.

CLINICAL DATA: Initial treatment strategy for vaginal cancer.

EXAM:
NUCLEAR MEDICINE PET SKULL BASE TO THIGH
TECHNIQUE: 6.7 mCi F-18 FDG was injected intravenously. Full-ring PET imaging
was performed from the skull base to thigh after the radiotracer. CT
data was obtained and used for attenuation correction and anatomic
localization.
FASTING BLOOD GLUCOSE:  Value: 92 mg/dl

[Series 3: pet sk_thigh ac · axial · 5.0mm · 4.07mm/px · z∈[-720,+156]mm · 4 of 220 slices shown]
[im 1/220]
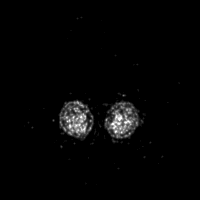
[im 74/220]
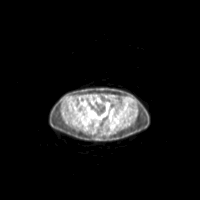
[im 147/220]
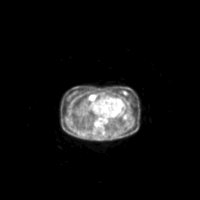
[im 220/220]
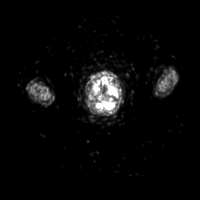

[Series 4: ct sk_thigh 5.0 b31f · axial · 5.0mm · 0.98mm/px · z∈[-720,+156]mm · 5 of 220 slices shown]
[im 1/220]
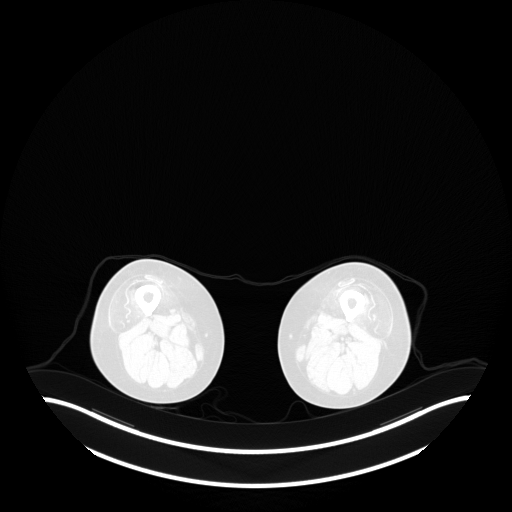
[im 55/220]
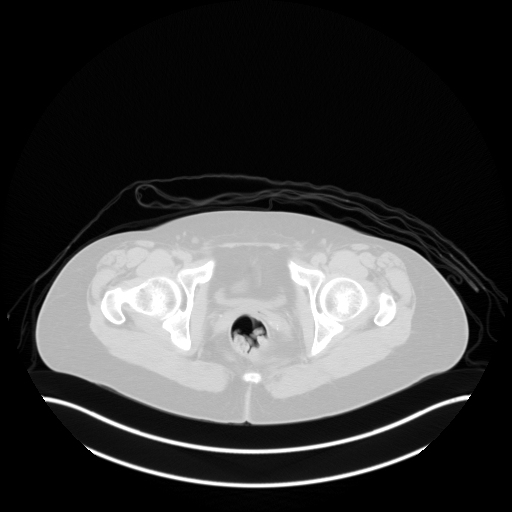
[im 110/220]
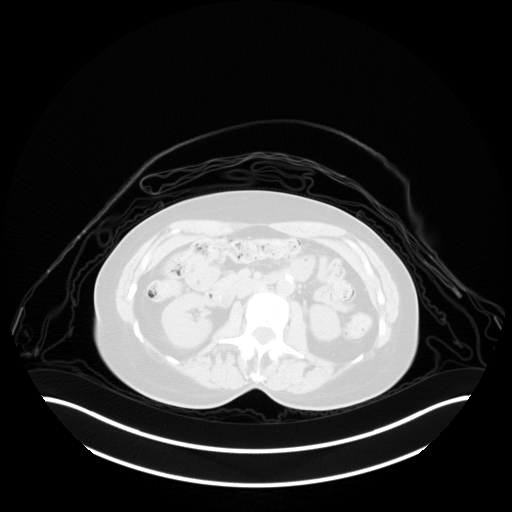
[im 165/220]
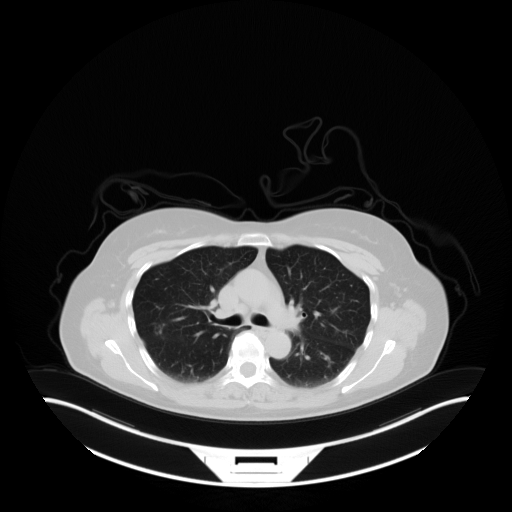
[im 220/220]
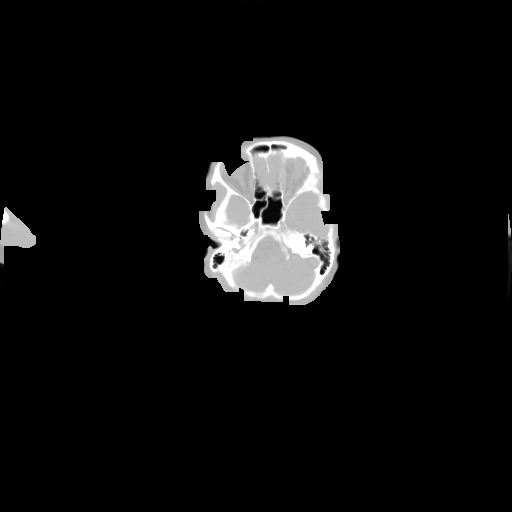

[Series 7: pet sk_thigh nac · axial · 5.0mm · 4.07mm/px · z∈[-720,+156]mm · 5 of 220 slices shown]
[im 1/220]
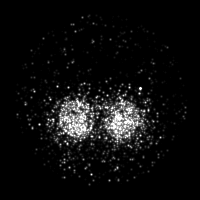
[im 55/220]
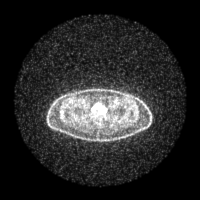
[im 110/220]
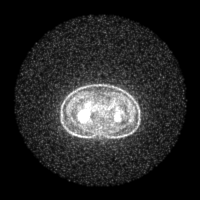
[im 165/220]
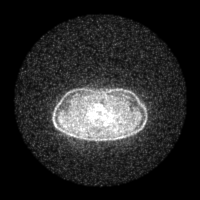
[im 220/220]
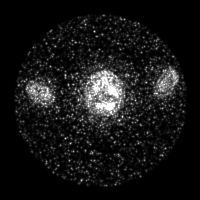

[Series 8: ct sk_thigh 5.0 b70f (id) · axial · 5.0mm · 0.63mm/px · z∈[-208,+52]mm · 2 of 66 slices shown]
[im 1/66  bone]
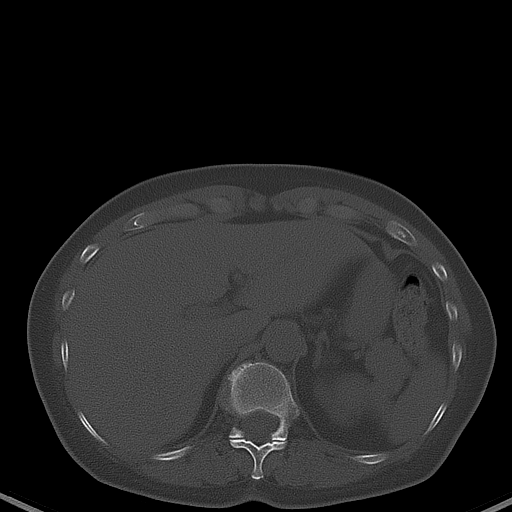
[im 66/66  bone]
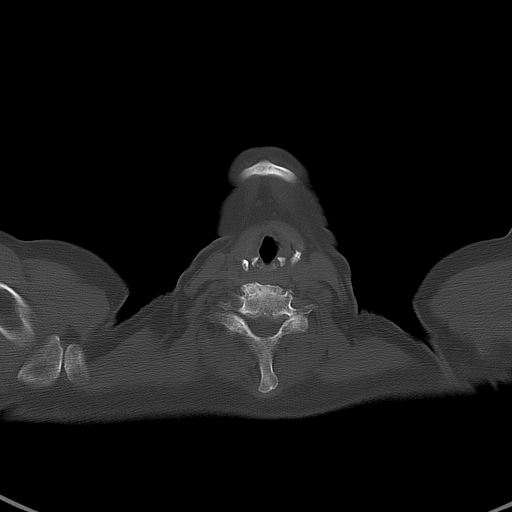

[Series 603: mip collection · coronal · 1.82mm/px · 1 of 32 slices shown]
[im 1/32]
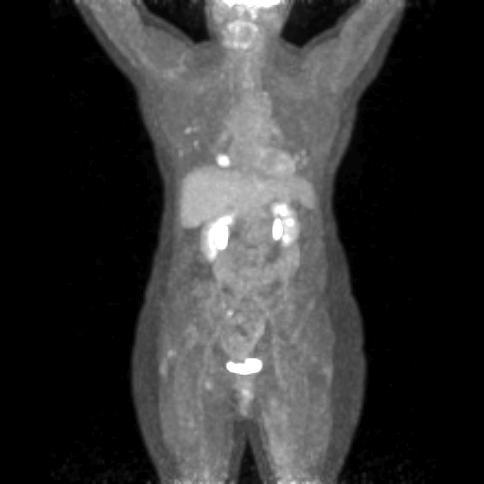

[Series 604: range-ct sk_thigh 5.0 (id)<alpha range> · 2 of 86 slices shown (1 of 2)]
[im 1/86]
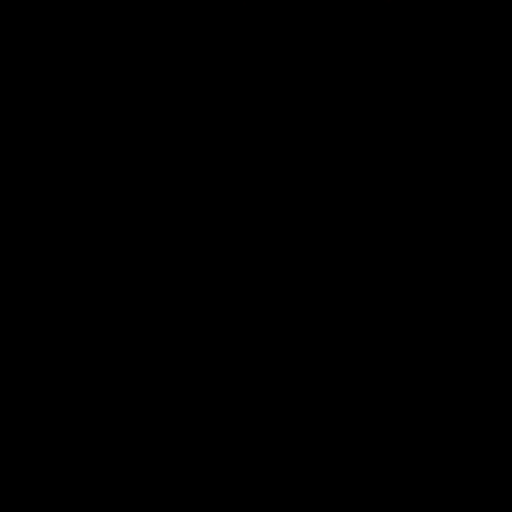
[im 86/86]
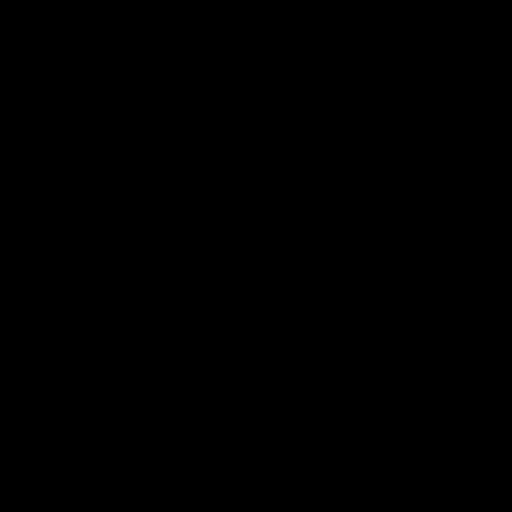

[Series 606: range-ct sk_thigh 5.0 (id)<alpha range> · 5 of 210 slices shown (2 of 2)]
[im 1/210]
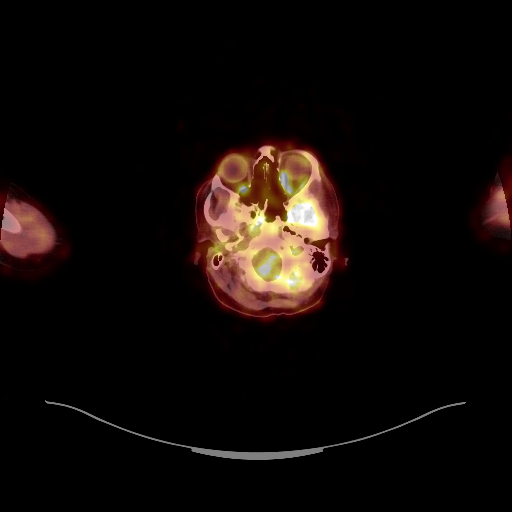
[im 53/210]
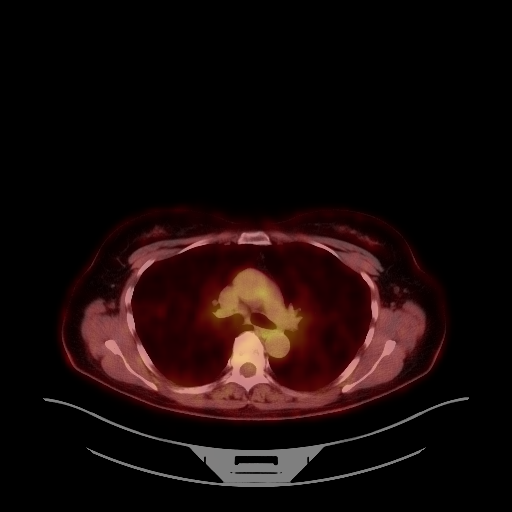
[im 105/210]
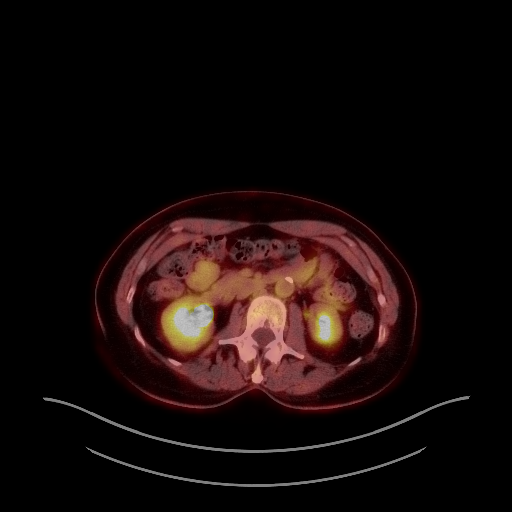
[im 157/210]
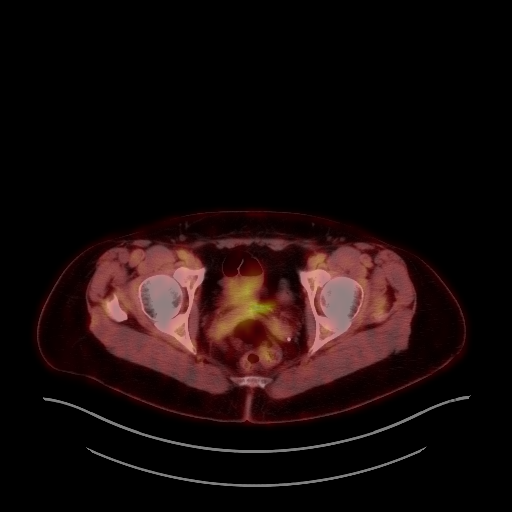
[im 210/210]
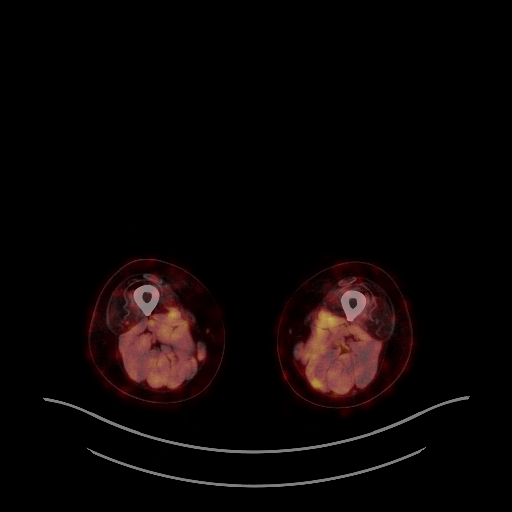

[Series 1032: results mm oncology reading · 1.01mm/px · 1 of 3 slices shown]
[im 1/3]
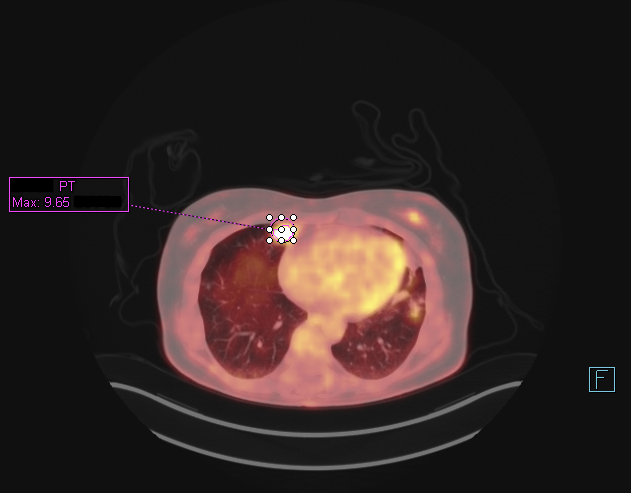

[25 of 25 positions shown; findings below may reference images not displayed]

FINDINGS: NECK

No hypermetabolic lymph nodes in the neck. CT images show no acute
findings.

CHEST

No hypermetabolic mediastinal, hilar or axillary lymph nodes.
Scattered areas of peribronchovascular ground-glass and
consolidation are seen bilaterally with corresponding
hypermetabolism. Index consolidation in the medial segment right
middle lobe has an SUV max of 9.7. Focal hypermetabolism is seen
within the lower inner quadrant of the left breast, with an SUV max
of 3.4. Atherosclerotic calcification of the arterial vasculature.
Heart is at the upper limits of normal in size. No pericardial or
pleural effusion.

ABDOMEN/PELVIS

No abnormal hypermetabolism in the liver, adrenal glands, spleen or
pancreas. No hypermetabolic lymph nodes. Mild hypermetabolism is
seen along the vaginal canal/introitus, with an SUV max of 5.8.
Liver, gallbladder, adrenal glands, kidneys and spleen are
unremarkable. A 1.9 cm low-attenuation lesion is seen in the region
of the uncinate process of the pancreas but the exact location is
difficult to discern without IV contrast. 5 mm low-attenuation
lesion in the body of the pancreas. Stomach and bowel are grossly
unremarkable. Scattered atherosclerotic calcification of the
arterial vasculature.

SKELETON

No abnormal osseous hypermetabolism. Degenerative changes are seen
in the spine.
IMPRESSION: 1. Mild hypermetabolism along the vaginal canal/introitus. No
evidence of local/regional metastatic disease.
2. Scattered areas of peribronchovascular ground-glass and
consolidation in the lungs, with associated hypermetabolism. CT
appearance is most indicative of bronchopneumonia. Please correlate
clinically.
3. Low-attenuation lesions in the pancreas. Follow-up CT abdomen
pelvis, preferably with contrast, is recommended in 1 year. This
recommendation follows ACR consensus guidelines: Management of
Incidental Pancreatic Cysts: A White Paper of the ACR Incidental
Findings Committee. [HOSPITAL] 6764;[DATE].
4. Focal hypermetabolism in the lower inner quadrant of the left
breast. Correlation with mammography is suggested, as clinically
indicated.
5. Aortic atherosclerosis (CGD8A-170.0).

## 2018-02-24 ENCOUNTER — Telehealth: Payer: Self-pay

## 2018-02-24 NOTE — Telephone Encounter (Signed)
Patient called to confirm chest x-ray results 02/07/2018. Results confirmed with patient.

## 2018-04-07 ENCOUNTER — Telehealth: Payer: Self-pay | Admitting: *Deleted

## 2018-04-07 NOTE — Telephone Encounter (Signed)
Called and spoke with the patient, moved appt from 7/19 to 7/23

## 2018-04-14 HISTORY — PX: UPPER GI ENDOSCOPY: SHX6162

## 2018-05-02 ENCOUNTER — Ambulatory Visit: Payer: 59 | Admitting: Gynecologic Oncology

## 2018-05-06 ENCOUNTER — Encounter: Payer: Self-pay | Admitting: Gynecologic Oncology

## 2018-05-06 ENCOUNTER — Inpatient Hospital Stay: Payer: 59 | Attending: Gynecologic Oncology | Admitting: Gynecologic Oncology

## 2018-05-06 VITALS — BP 123/77 | HR 66 | Temp 98.4°F | Resp 20 | Ht 64.0 in | Wt 137.1 lb

## 2018-05-06 DIAGNOSIS — Z90710 Acquired absence of both cervix and uterus: Secondary | ICD-10-CM | POA: Insufficient documentation

## 2018-05-06 DIAGNOSIS — C52 Malignant neoplasm of vagina: Secondary | ICD-10-CM | POA: Diagnosis not present

## 2018-05-06 NOTE — Patient Instructions (Signed)
Please notify Dr Denman George at phone number [phone] if you notice vaginal bleeding, new pelvic or abdominal pains, bloating, feeling full easy, or a change in bladder or bowel function.   Please return to see Dr Denman George in 3 months as scheduled.

## 2018-05-06 NOTE — Progress Notes (Signed)
Follow-up Note: Gyn-Onc  Consult was initially requested by Dr. Sabra Heck for the evaluation of Patricia Gentry 62 y.o. female  CC:  Chief Complaint  Patient presents with  . Vaginal cancer [facility])    Assessment/Plan:  Patricia Gentry  is a 62 y.o.  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  No evidence for recurrence.   I will evaluate her in 3 months.   Next pap smear due December, 2019.  Continue serial exams every 3 months until February, 2020 and annual pap with obligate HPV testing annually. There is no benefit for increasing the frequency of paps unless dysplasia is identified during surveillance.  HPI: Patricia Gentry is a very pleasant 62 year old G0 who is seen in consultation at the request of Dr Sabra Heck for [place].  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 She underwent an abdominal hysterectomy in 1984 for endometriosis. Approximate 4 years following this she began developing vaginal dysplasia. This predominantly low-grade and required minimal intervention. Her last interventional procedure was a vaginal laser for high-grade lesion in 2005. Following that she had a series of ASCUS Pap's with negative high-risk HPV. However in 09/02/2015 she had an ASCUS pap with high-risk HPV identified. This prompted a colposcopic evaluation on 09/15/2015 and biopsies of the anterior vagina. Dr. Sabra Heck notes that she sore a lesion on the anterior wall of the vaginal cuff. 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  The patient denies dyspareunia or vaginal bleeding or abnormal discharge. She is otherwise a very healthy woman who is treated only with estradiol orally and antianxiety medication.  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   On January 5th, 2017  she underwent a CO2 laser of the anterior vaginal cuff. The procedure was uncomplicated.  Pap in July 2017 showed ASCUS.  Colpo and directed biopsies of a slightly erythematous area at the cuff showed "fragments of reactive inflamed squamous epithelium, negative for dysplasia or malignancy" on 09/20/16.  On 11/02/16 she underwent colposcopic directed biopsies of the anterior and posterior vaginal cuff. The anterior cuff biopsies confirmed focal invasive SCC. There was no grossly visible lesion on inspection and no mass to palpate.  PET/CT on 11/19/16 showed no vaginal or pelvic mass and no distant metastatic disease .(an area seen in the left breast corresponded to a traumatic injury).  On 12/04/16 she underwent robotic assisted upper vaginectomy. The final pathology revealed a 45mm residual focus of squamous cell carcinoma (2.28mm on original biopsy). The margins were widely negative peripherally with a 8mm deep margin (this represents where the bladder was reflected from the upper vagina). There was a small focus of LVSI on the specimen.  Interval Hx: 10 weeks post op she had intercourse which was painful. She felt a "pop" and had vaginal spotting for 2 days afterwards. Granulation tissue was seen (and biopsy confirmed) on exam.  She has had no bleeding issues since that time. Pap in December, 2018 was benign and HPV negative.   She developed a sinus infection in February, 2019 with persistent cough unresolved with steroids and antibiotics. CXR was normal.  She has developed dysphagia (esophageal) - negative EGD, refractory to "stretching".    Current Meds:  Outpatient Encounter Medications as of 05/06/2018  Medication Sig  .  Cholecalciferol (VITAMIN D3) 2000 units TABS Take 1 tablet by mouth daily.  Marland Kitchen escitalopram (LEXAPRO) 10 MG tablet TAKE ONE (1) TABLET EACH DAY  . estradiol (ESTRACE) 0.5 MG tablet TAKE ONE (1) TABLET EACH DAY  . ibuprofen (ADVIL,MOTRIN) 200 MG tablet Take 400 mg by mouth  every 6 (six) hours as needed for mild pain.  . naproxen sodium (ANAPROX) 220 MG tablet Take 220 mg by mouth daily as needed (PAIN).  . Omega-3 Fatty Acids (FISH OIL) 1000 MG CAPS Take 1,000 mg by mouth daily.  Marland Kitchen omeprazole (PRILOSEC) 20 MG capsule Take 1 capsule by mouth daily.  Marland Kitchen senna (SENOKOT) 8.6 MG TABS tablet Take 1 tablet (8.6 mg total) by mouth at bedtime. (Patient taking differently: Take 1 tablet by mouth as needed. )  . vitamin C (ASCORBIC ACID) 500 MG tablet Take 500 mg by mouth daily.  . [DISCONTINUED] Cinnamon 500 MG TABS Take 500 mg by mouth daily.  . [DISCONTINUED] ranitidine (ZANTAC) 150 MG tablet Take 150 mg by mouth 2 (two) times daily as needed (ACID REFLUX).    No facility-administered encounter medications on file as of 05/06/2018.     Allergy:  No Known Allergies  Social Hx:   Social History   Socioeconomic History  . Marital status: Divorced    Spouse name: Not on file  . Number of children: Not on file  . Years of education: Not on file  . Highest education level: Not on file  Occupational History  . Not on file  Social Needs  . Financial resource strain: Not on file  . Food insecurity:    Worry: Not on file    Inability: Not on file  . Transportation needs:    Medical: Not on file    Non-medical: Not on file  Tobacco Use  . Smoking status: Never Smoker  . Smokeless tobacco: Never Used  Substance and Sexual Activity  . Alcohol use: No    Alcohol/week: 0.0 oz  . Drug use: No  . Sexual activity: Yes    Partners: Male    Birth control/protection: Surgical    Comment: LAVH/BSO  Lifestyle  . Physical activity:    Days per week: Not on file    Minutes per session: Not on file  . Stress: Not on file  Relationships  . Social connections:    Talks on phone: Not on file    Gets together: Not on file    Attends religious service: Not on file    Active member of club or organization: Not on file    Attends meetings of clubs or organizations: Not on  file    Relationship status: Not on file  . Intimate partner violence:    Fear of current or ex partner: Not on file    Emotionally abused: Not on file    Physically abused: Not on file    Forced sexual activity: Not on file  Other Topics Concern  . Not on file  Social History Narrative  . Not on file    Past Surgical Hx:  Past Surgical History:  Procedure Laterality Date  . BREAST BIOPSY    . CERVICAL CONE BIOPSY  mid  1980's  . CO2 LASER APPLICATION N/A 0/06/6044   Procedure: CO2 LASER APPLICATION OF THE VAGINA;  Surgeon: Everitt Amber, MD;  Location: [facility];  Service: Gynecology;  Laterality: N/A;  . LAPAROSCOPIC ASSISTED VAGINAL HYSTERECTOMY  1984   and BSO  . LASER ABLATION VAGINAL DYSPLASIA  2005   high-grade   . ROBOTIC ASSISTED TOTAL HYSTERECTOMY N/A 12/04/2016   Procedure: ROBOTIC ASSISTED UPPER VAGINECTOMY;  Surgeon: Everitt Amber, MD;  Location: [facility];  Service: Gynecology;  Laterality: N/A;  . UPPER GI ENDOSCOPY  04/2018    Past Medical Hx:  Past Medical History:  Diagnosis Date  . Anxiety   . Complication of anesthesia    slow to wake  . Depression   . GERD (gastroesophageal reflux disease)   . High risk HPV infection    per Pap 09-02-2015  . History of cervical dysplasia    CONIZATION 1980'S  . History of condyloma acuminatum    vulva--  2009  . History of vaginal dysplasia    VAIN 2--  LASER ABLATION 2005  . PONV (postoperative nausea and vomiting)   . VAIN III (vaginal intraepithelial neoplasia grade III)   . Wears glasses     Past Gynecological History:  G0, hysterectomy for endometriosis in 1984  Patient's last menstrual period was 10/15/1992.  Family Hx:  Family History  Problem Relation Age of Onset  . Hypertension Mother   . Heart disease Mother   . Hypertension Father   . Heart disease Father   . Hypertension Brother   . Heart disease Brother   . Stroke Brother   . Diabetes Brother        younger brother  .  Osteoporosis Maternal Aunt     Review of Systems:  Constitutional  Feels well,    ENT Normal appearing ears and nares bilaterally Skin/Breast  No rash, sores, jaundice, itching, dryness Cardiovascular  No chest pain, shortness of breath, or edema  Pulmonary  + cough.  Gastro Intestinal  No nausea, vomitting, or diarrhoea. No bright red blood per rectum, no abdominal pain, change in bowel movement, or constipation.  Genito Urinary  No frequency, urgency, dysuria, see HPI Musculo Skeletal  No myalgia, arthralgia, joint swelling or pain  Neurologic  No weakness, numbness, change in gait,  Psychology  No depression, anxiety, insomnia.   Vitals:  Blood pressure 123/77, pulse 66, temperature 98.4 F (36.9 C), temperature source Oral, resp. rate 20, height 5\' 4"  (1.626 m), weight 137 lb 1.6 oz (62.2 kg), last menstrual period 10/15/1992, SpO2 100 %.  Physical Exam: WD in NAD Neck  Supple NROM, without any enlargements.  Lymph Node Survey No cervical supraclavicular or inguinal adenopathy Cardiovascular  Pulse normal rate, regularity and rhythm. S1 and S2 normal.  Lungs  Clear to auscultation bilateraly, without wheezes/crackles/rhonchi. Good air movement.  Skin  No rash/lesions/breakdown  Psychiatry  Alert and oriented to person, place, and time  Abdomen  Normoactive bowel sounds, abdomen soft, non-tender and thin without evidence of hernia.  Back No CVA tenderness Genito Urinary  Vulva/vagina: Normal external female genitalia.  No lesions. No discharge or bleeding.  Bladder/urethra:  No lesions or masses, well supported bladder  Vagina: slight hyperemia at vaginal cuff. No nodularity or mass.  Cervix: surgically absent  Uterus: surgically absent.  Adnexa: no palpable masses. Rectal  Good tone, no masses no cul de sac nodularity.  Extremities  No bilateral cyanosis, clubbing or edema    Patricia Solo, MD  05/06/2018, 4:20 PM

## 2018-08-18 ENCOUNTER — Inpatient Hospital Stay: Payer: 59 | Attending: Gynecologic Oncology | Admitting: Gynecologic Oncology

## 2018-08-18 ENCOUNTER — Encounter: Payer: Self-pay | Admitting: Gynecologic Oncology

## 2018-08-18 ENCOUNTER — Other Ambulatory Visit (HOSPITAL_COMMUNITY)
Admission: RE | Admit: 2018-08-18 | Discharge: 2018-08-18 | Disposition: A | Discharge status: Home / Self Care | Payer: 59 | Source: Ambulatory Visit | Attending: Gynecologic Oncology | Admitting: Gynecologic Oncology

## 2018-08-18 VITALS — BP 129/81 | HR 74 | Temp 98.4°F | Resp 18 | Ht 64.0 in | Wt 139.0 lb

## 2018-08-18 DIAGNOSIS — C52 Malignant neoplasm of vagina: Secondary | ICD-10-CM | POA: Diagnosis present

## 2018-08-18 DIAGNOSIS — Z9079 Acquired absence of other genital organ(s): Secondary | ICD-10-CM

## 2018-08-18 DIAGNOSIS — Z90710 Acquired absence of both cervix and uterus: Secondary | ICD-10-CM | POA: Diagnosis not present

## 2018-08-18 NOTE — Progress Notes (Signed)
Follow-up Note: Gyn-Onc  Consult was initially requested by Dr. Sabra Heck for the evaluation of Patricia Gentry 62 y.o. female  CC:  Chief Complaint  Patient presents with  . Vaginal Cancer    history of    Assessment/Plan:  Patricia Gentry  is a 62 y.o.  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  No evidence for recurrence.   I will evaluate her in 3 months.   Next pap smear due December, 2020.  Continue serial exams every 3 months until February, 2020 and annual pap with obligate HPV testing annually. There is no benefit for increasing the frequency of paps unless dysplasia is identified during surveillance.  HPI: Patricia Gentry is a very pleasant 62 year old G0 who is seen in consultation at the request of Dr Sabra Heck for [place].  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 She underwent an abdominal hysterectomy in 1984 for endometriosis. Approximate 4 years following this she began developing vaginal dysplasia. This predominantly low-grade and required minimal intervention. Her last interventional procedure was a vaginal laser for high-grade lesion in 2005. Following that she had a series of ASCUS Pap's with negative high-risk HPV. However in 09/02/2015 she had an ASCUS pap with high-risk HPV identified. This prompted a colposcopic evaluation on 09/15/2015 and biopsies of the anterior vagina. Dr. Sabra Heck notes that she sore a lesion on the anterior wall of the vaginal cuff. 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  The patient denies dyspareunia or vaginal bleeding or abnormal discharge. She is otherwise a very healthy woman who is treated only with estradiol orally and antianxiety medication.  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   On January  5th, 2017 she underwent a CO2 laser of the anterior vaginal cuff. The procedure was uncomplicated.  Pap in July 2017 showed ASCUS.  Colpo and directed biopsies of a slightly erythematous area at the cuff showed "fragments of reactive inflamed squamous epithelium, negative for dysplasia or malignancy" on 09/20/16.  On 11/02/16 she underwent colposcopic directed biopsies of the anterior and posterior vaginal cuff. The anterior cuff biopsies confirmed focal invasive SCC. There was no grossly visible lesion on inspection and no mass to palpate.  PET/CT on 11/19/16 showed no vaginal or pelvic mass and no distant metastatic disease .(an area seen in the left breast corresponded to a traumatic injury).  On 12/04/16 she underwent robotic assisted upper vaginectomy. The final pathology revealed a 56mm residual focus of squamous cell carcinoma (2.39mm on original biopsy). The margins were widely negative peripherally with a 74mm deep margin (this represents where the bladder was reflected from the upper vagina). There was a small focus of LVSI on the specimen.  10 weeks post op she had intercourse which was painful. She felt a "pop" and had vaginal spotting for 2 days afterwards. Granulation tissue was seen (and biopsy confirmed) on exam.  She has had no bleeding issues since that time. Pap in December, 2018 was benign and HPV negative.   She developed dysphagia (esophageal) - negative EGD, refractory to "stretching".   Interval Hx: She is doing well with no complaints or concerns. Not sexually active by choice as she is not married to her partner.  Current Meds:  Outpatient Encounter Medications as of  08/18/2018  Medication Sig  . Cholecalciferol (VITAMIN D3) 2000 units TABS Take 1 tablet by mouth daily.  Marland Kitchen escitalopram (LEXAPRO) 10 MG tablet TAKE ONE (1) TABLET EACH DAY  . estradiol (ESTRACE) 0.5 MG tablet TAKE ONE (1) TABLET EACH DAY  . ibuprofen (ADVIL,MOTRIN) 200 MG tablet Take 400 mg by mouth every 6  (six) hours as needed for mild pain.  . naproxen sodium (ANAPROX) 220 MG tablet Take 220 mg by mouth daily as needed (PAIN).  . Omega-3 Fatty Acids (FISH OIL) 1000 MG CAPS Take 1,000 mg by mouth daily.  Marland Kitchen omeprazole (PRILOSEC) 20 MG capsule Take 1 capsule by mouth daily.  Marland Kitchen senna (SENOKOT) 8.6 MG TABS tablet Take 1 tablet (8.6 mg total) by mouth at bedtime. (Patient taking differently: Take 1 tablet by mouth as needed. )  . vitamin C (ASCORBIC ACID) 500 MG tablet Take 500 mg by mouth daily.   No facility-administered encounter medications on file as of 08/18/2018.     Allergy:  No Known Allergies  Social Hx:   Social History   Socioeconomic History  . Marital status: Divorced    Spouse name: Not on file  . Number of children: Not on file  . Years of education: Not on file  . Highest education level: Not on file  Occupational History  . Not on file  Social Needs  . Financial resource strain: Not on file  . Food insecurity:    Worry: Not on file    Inability: Not on file  . Transportation needs:    Medical: Not on file    Non-medical: Not on file  Tobacco Use  . Smoking status: Never Smoker  . Smokeless tobacco: Never Used  Substance and Sexual Activity  . Alcohol use: No    Alcohol/week: 0.0 standard drinks  . Drug use: No  . Sexual activity: Yes    Partners: Male    Birth control/protection: Surgical    Comment: LAVH/BSO  Lifestyle  . Physical activity:    Days per week: Not on file    Minutes per session: Not on file  . Stress: Not on file  Relationships  . Social connections:    Talks on phone: Not on file    Gets together: Not on file    Attends religious service: Not on file    Active member of club or organization: Not on file    Attends meetings of clubs or organizations: Not on file    Relationship status: Not on file  . Intimate partner violence:    Fear of current or ex partner: Not on file    Emotionally abused: Not on file    Physically abused: Not  on file    Forced sexual activity: Not on file  Other Topics Concern  . Not on file  Social History Narrative  . Not on file    Past Surgical Hx:  Past Surgical History:  Procedure Laterality Date  . BREAST BIOPSY    . CERVICAL CONE BIOPSY  mid  1980's  . CO2 LASER APPLICATION N/A 02/20/5637   Procedure: CO2 LASER APPLICATION OF THE VAGINA;  Surgeon: Everitt Amber, MD;  Location: [facility];  Service: Gynecology;  Laterality: N/A;  . LAPAROSCOPIC ASSISTED VAGINAL HYSTERECTOMY  1984   and BSO  . LASER ABLATION VAGINAL DYSPLASIA  2005   high-grade   . ROBOTIC ASSISTED TOTAL HYSTERECTOMY N/A 12/04/2016   Procedure: ROBOTIC ASSISTED UPPER VAGINECTOMY;  Surgeon: Everitt Amber, MD;  Location:  [facility];  Service: Gynecology;  Laterality: N/A;  . UPPER GI ENDOSCOPY  04/2018    Past Medical Hx:  Past Medical History:  Diagnosis Date  . Anxiety   . Complication of anesthesia    slow to wake  . Depression   . GERD (gastroesophageal reflux disease)   . High risk HPV infection    per Pap 09-02-2015  . History of cervical dysplasia    CONIZATION 1980'S  . History of condyloma acuminatum    vulva--  2009  . History of vaginal dysplasia    VAIN 2--  LASER ABLATION 2005  . PONV (postoperative nausea and vomiting)   . VAIN III (vaginal intraepithelial neoplasia grade III)   . Wears glasses     Past Gynecological History:  G0, hysterectomy for endometriosis in 1984  Patient's last menstrual period was 10/15/1992.  Family Hx:  Family History  Problem Relation Age of Onset  . Hypertension Mother   . Heart disease Mother   . Hypertension Father   . Heart disease Father   . Hypertension Brother   . Heart disease Brother   . Stroke Brother   . Diabetes Brother        younger brother  . Osteoporosis Maternal Aunt     Review of Systems:  Constitutional  Feels well,    ENT Normal appearing ears and nares bilaterally Skin/Breast  No rash, sores, jaundice, itching,  dryness Cardiovascular  No chest pain, shortness of breath, or edema  Pulmonary  + cough.  Gastro Intestinal  No nausea, vomitting, or diarrhoea. No bright red blood per rectum, no abdominal pain, change in bowel movement, or constipation.  Genito Urinary  No frequency, urgency, dysuria, see HPI Musculo Skeletal  No myalgia, arthralgia, joint swelling or pain  Neurologic  No weakness, numbness, change in gait,  Psychology  No depression, anxiety, insomnia.   Vitals:  Last menstrual period 10/15/1992.  Physical Exam: WD in NAD Neck  Supple NROM, without any enlargements.  Lymph Node Survey No cervical supraclavicular or inguinal adenopathy Cardiovascular  Pulse normal rate, regularity and rhythm. S1 and S2 normal.  Lungs  Clear to auscultation bilateraly, without wheezes/crackles/rhonchi. Good air movement.  Skin  No rash/lesions/breakdown  Psychiatry  Alert and oriented to person, place, and time  Abdomen  Normoactive bowel sounds, abdomen soft, non-tender and thin without evidence of hernia.  Back No CVA tenderness Genito Urinary  Vulva/vagina: Normal external female genitalia.  No lesions. No discharge or bleeding.  Bladder/urethra:  No lesions or masses, well supported bladder  Vagina: slight hyperemia at vaginal cuff. No nodularity or mass. Pap taken.   Cervix: surgically absent  Uterus: surgically absent.  Adnexa: no palpable masses. Rectal  Good tone, no masses no cul de sac nodularity.  Extremities  No bilateral cyanosis, clubbing or edema    Thereasa Solo, MD  08/18/2018, 3:05 PM

## 2018-08-18 NOTE — Patient Instructions (Signed)
Dr Denman George has performed a pap smear today and will contact you with the results in approximately one week.  If these are normal, she will see you again in February, 2020.  If your exams remain normal at that time, she will see you at 6 monthly intervals thereafter until 2023.

## 2018-08-25 LAB — CYTOLOGY - PAP
DIAGNOSIS: NEGATIVE
HPV: NOT DETECTED

## 2018-08-26 ENCOUNTER — Telehealth: Payer: Self-pay | Admitting: *Deleted

## 2018-08-26 NOTE — Telephone Encounter (Signed)
I called to inform patient of her pap results from  08/18/18.  Per Joylene John, NP pap results are normal and showing benign radiation changes.  Patient was very appreciative and verbalized understanding.

## 2018-11-13 ENCOUNTER — Other Ambulatory Visit: Payer: Self-pay | Admitting: Obstetrics & Gynecology

## 2018-11-13 NOTE — Telephone Encounter (Signed)
Please let her know the prescritions were called in but the estrace was for just 90 days as I know her MMG is just due right now.  I will do the RF for a year once I have the report.  Thanks.

## 2018-11-13 NOTE — Telephone Encounter (Signed)
Medication refill request: LEXAPRO and ESTRACE  Last AEX:  09/02/15 SM Next AEX: not scheduled, this patient has seen Dr. Denman George within the last two months Last MMG (if hormonal medication request): 11/05/17 BIRADS 1negative/density c  Refill authorized: 11/06/17 #30 w/12 refills for the Lexapro and 10/21/17 #90 w/4 refills for the Estrace; today please advise. Order pended for #30 w/0 refills

## 2018-11-14 NOTE — Telephone Encounter (Signed)
Called patient unable to leave voicemail  

## 2018-11-17 ENCOUNTER — Inpatient Hospital Stay: Payer: 59 | Attending: Gynecologic Oncology | Admitting: Gynecologic Oncology

## 2018-11-17 ENCOUNTER — Encounter: Payer: Self-pay | Admitting: Gynecologic Oncology

## 2018-11-17 VITALS — BP 119/85 | HR 67 | Temp 98.6°F | Resp 20 | Ht 64.0 in | Wt 135.1 lb

## 2018-11-17 DIAGNOSIS — Z90710 Acquired absence of both cervix and uterus: Secondary | ICD-10-CM | POA: Diagnosis not present

## 2018-11-17 DIAGNOSIS — Z90722 Acquired absence of ovaries, bilateral: Secondary | ICD-10-CM | POA: Insufficient documentation

## 2018-11-17 DIAGNOSIS — C52 Malignant neoplasm of vagina: Secondary | ICD-10-CM | POA: Insufficient documentation

## 2018-11-17 NOTE — Progress Notes (Signed)
Follow-up Note: Gyn-Onc  Consult was initially requested by Dr. Sabra Heck for the evaluation of Patricia Gentry 63 y.o. female  CC:  Chief Complaint  Patient presents with  . Vaginal cancer [facility])    Assessment/Plan:  Patricia Gentry  is a 63 y.o.  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  No evidence for recurrence.   I will evaluate her in 3 months.   Next pap smear due December, 2020.  Continue serial exams every 6 months until February, 2023 and annual pap with obligate HPV testing annually. There is no benefit for increasing the frequency of paps unless dysplasia is identified during surveillance.  HPI: Patricia Gentry is a very pleasant 63 year old G0 who is seen in consultation at the request of Dr Sabra Heck for [place].  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 She underwent an abdominal hysterectomy in 1984 for endometriosis. Approximate 4 years following this she began developing vaginal dysplasia. This predominantly low-grade and required minimal intervention. Her last interventional procedure was a vaginal laser for high-grade lesion in 2005. Following that she had a series of ASCUS Pap's with negative high-risk HPV. However in 09/02/2015 she had an ASCUS pap with high-risk HPV identified. This prompted a colposcopic evaluation on 09/15/2015 and biopsies of the anterior vagina. Dr. Sabra Heck notes that she sore a lesion on the anterior wall of the vaginal cuff. 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  The patient denies dyspareunia or vaginal bleeding or abnormal discharge. She is otherwise a very healthy woman who is treated only with estradiol orally and antianxiety medication.  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   On January 5th, 2017  she underwent a CO2 laser of the anterior vaginal cuff. The procedure was uncomplicated.  Pap in July 2017 showed ASCUS.  Colpo and directed biopsies of a slightly erythematous area at the cuff showed "fragments of reactive inflamed squamous epithelium, negative for dysplasia or malignancy" on 09/20/16.  On 11/02/16 she underwent colposcopic directed biopsies of the anterior and posterior vaginal cuff. The anterior cuff biopsies confirmed focal invasive SCC. There was no grossly visible lesion on inspection and no mass to palpate.  PET/CT on 11/19/16 showed no vaginal or pelvic mass and no distant metastatic disease .(an area seen in the left breast corresponded to a traumatic injury).  On 12/04/16 she underwent robotic assisted upper vaginectomy. The final pathology revealed a 47mm residual focus of squamous cell carcinoma (2.57mm on original biopsy). The margins were widely negative peripherally with a 41mm deep margin (this represents where the bladder was reflected from the upper vagina). There was a small focus of LVSI on the specimen.  10 weeks post op she had intercourse which was painful. She felt a "pop" and had vaginal spotting for 2 days afterwards. Granulation tissue was seen (and biopsy confirmed) on exam.  She has had no bleeding issues since that time. Pap in December, 2018 was benign and HPV negative.   She developed dysphagia (esophageal) - negative EGD, refractory to "stretching".   Pap in November, 2019 was benign with HPV negative  Interval Hx: She is doing well with no complaints or concerns. Not sexually active by choice as she is not married to her partner.  Current Meds:  Outpatient Encounter Medications as of 11/17/2018  Medication Sig  . Cholecalciferol (VITAMIN D3) 2000 units TABS Take 1 tablet by mouth daily.  Marland Kitchen escitalopram (LEXAPRO) 10 MG tablet TAKE 1 TABLET BY MOUTH ONCE DAILY  . estradiol (ESTRACE) 0.5 MG tablet TAKE 1 TABLET BY MOUTH DAILY AS DIRECTED  .  ibuprofen (ADVIL,MOTRIN) 200 MG tablet Take 400 mg by mouth every 6 (six) hours as needed for mild pain.  . naproxen sodium (ANAPROX) 220 MG tablet Take 220 mg by mouth daily as needed (PAIN).  Marland Kitchen omeprazole (PRILOSEC) 20 MG capsule Take 1 capsule by mouth daily.  Marland Kitchen senna (SENOKOT) 8.6 MG TABS tablet Take 1 tablet (8.6 mg total) by mouth at bedtime. (Patient taking differently: Take 1 tablet by mouth as needed. )  . vitamin C (ASCORBIC ACID) 500 MG tablet Take 500 mg by mouth daily.  . [DISCONTINUED] Omega-3 Fatty Acids (FISH OIL) 1000 MG CAPS Take 1,000 mg by mouth daily.   No facility-administered encounter medications on file as of 11/17/2018.     Allergy:  No Known Allergies  Social Hx:   Social History   Socioeconomic History  . Marital status: Divorced    Spouse name: Not on file  . Number of children: Not on file  . Years of education: Not on file  . Highest education level: Not on file  Occupational History  . Not on file  Social Needs  . Financial resource strain: Not on file  . Food insecurity:    Worry: Not on file    Inability: Not on file  . Transportation needs:    Medical: Not on file    Non-medical: Not on file  Tobacco Use  . Smoking status: Never Smoker  . Smokeless tobacco: Never Used  Substance and Sexual Activity  . Alcohol use: No    Alcohol/week: 0.0 standard drinks  . Drug use: No  . Sexual activity: Yes    Partners: Male    Birth control/protection: Surgical    Comment: LAVH/BSO  Lifestyle  . Physical activity:    Days per week: Not on file    Minutes per session: Not on file  . Stress: Not on file  Relationships  . Social connections:    Talks on phone: Not on file    Gets together: Not on file    Attends religious service: Not on file    Active member of club or organization: Not on file    Attends meetings of clubs or organizations: Not on file    Relationship status: Not on file  . Intimate partner violence:    Fear of current or ex  partner: Not on file    Emotionally abused: Not on file    Physically abused: Not on file    Forced sexual activity: Not on file  Other Topics Concern  . Not on file  Social History Narrative  . Not on file    Past Surgical Hx:  Past Surgical History:  Procedure Laterality Date  . BREAST BIOPSY    . CERVICAL CONE BIOPSY  mid  1980's  . CO2 LASER APPLICATION N/A 4/0/9735   Procedure: CO2 LASER APPLICATION OF THE VAGINA;  Surgeon: Everitt Amber, MD;  Location: [facility];  Service: Gynecology;  Laterality: N/A;  . LAPAROSCOPIC ASSISTED VAGINAL HYSTERECTOMY  1984   and BSO  . LASER ABLATION VAGINAL DYSPLASIA  2005   high-grade   . ROBOTIC ASSISTED TOTAL HYSTERECTOMY N/A 12/04/2016   Procedure: ROBOTIC ASSISTED  UPPER VAGINECTOMY;  Surgeon: Everitt Amber, MD;  Location: [facility];  Service: Gynecology;  Laterality: N/A;  . UPPER GI ENDOSCOPY  04/2018    Past Medical Hx:  Past Medical History:  Diagnosis Date  . Anxiety   . Complication of anesthesia    slow to wake  . Depression   . GERD (gastroesophageal reflux disease)   . High risk HPV infection    per Pap 09-02-2015  . History of cervical dysplasia    CONIZATION 1980'S  . History of condyloma acuminatum    vulva--  2009  . History of vaginal dysplasia    VAIN 2--  LASER ABLATION 2005  . PONV (postoperative nausea and vomiting)   . VAIN III (vaginal intraepithelial neoplasia grade III)   . Wears glasses     Past Gynecological History:  G0, hysterectomy for endometriosis in 1984  Patient's last menstrual period was 10/15/1992.  Family Hx:  Family History  Problem Relation Age of Onset  . Hypertension Mother   . Heart disease Mother   . Hypertension Father   . Heart disease Father   . Hypertension Brother   . Heart disease Brother   . Stroke Brother   . Diabetes Brother        younger brother  . Osteoporosis Maternal Aunt     Review of Systems:  Constitutional  Feels well,    ENT Normal appearing  ears and nares bilaterally Skin/Breast  No rash, sores, jaundice, itching, dryness Cardiovascular  No chest pain, shortness of breath, or edema  Pulmonary  + cough.  Gastro Intestinal  No nausea, vomitting, or diarrhoea. No bright red blood per rectum, no abdominal pain, change in bowel movement, or constipation.  Genito Urinary  No frequency, urgency, dysuria, see HPI Musculo Skeletal  No myalgia, arthralgia, joint swelling or pain  Neurologic  No weakness, numbness, change in gait,  Psychology  No depression, anxiety, insomnia.   Vitals:  Blood pressure 119/85, pulse 67, temperature 98.6 F (37 C), temperature source Oral, resp. rate 20, height 5\' 4"  (1.626 m), weight 135 lb 1.6 oz (61.3 kg), last menstrual period 10/15/1992, SpO2 97 %.  Physical Exam: WD in NAD Neck  Supple NROM, without any enlargements.  Lymph Node Survey No cervical supraclavicular or inguinal adenopathy Cardiovascular  Pulse normal rate, regularity and rhythm. S1 and S2 normal.  Lungs  Clear to auscultation bilateraly, without wheezes/crackles/rhonchi. Good air movement.  Skin  No rash/lesions/breakdown  Psychiatry  Alert and oriented to person, place, and time  Abdomen  Normoactive bowel sounds, abdomen soft, non-tender and thin without evidence of hernia.  Back No CVA tenderness Genito Urinary  Vulva/vagina: Normal external female genitalia.  No lesions. No discharge or bleeding.  Bladder/urethra:  No lesions or masses, well supported bladder  Vagina: slight hyperemia at vaginal cuff. No nodularity or mass. Pap taken.   Cervix: surgically absent  Uterus: surgically absent.  Adnexa: no palpable masses. Rectal  Good tone, no masses no cul de sac nodularity.  Extremities  No bilateral cyanosis, clubbing or edema    Patricia Solo, MD  11/17/2018, 3:22 PM

## 2018-11-17 NOTE — Patient Instructions (Signed)
Please notify Dr Denman George at phone number [phone] if you notice vaginal bleeding, new pelvic or abdominal pains, bloating, feeling full easy, or a change in bladder or bowel function.   Please contact Dr Serita Grit office (at [phone]) in April to request an appointment with her for July, 2020.

## 2018-11-20 NOTE — Telephone Encounter (Signed)
Left voicemail for patient with message from Dr. Sabra Heck

## 2018-11-27 ENCOUNTER — Other Ambulatory Visit: Payer: Self-pay | Admitting: Gynecologic Oncology

## 2018-11-27 DIAGNOSIS — Z1231 Encounter for screening mammogram for malignant neoplasm of breast: Secondary | ICD-10-CM

## 2018-12-23 ENCOUNTER — Ambulatory Visit
Admission: RE | Admit: 2018-12-23 | Discharge: 2018-12-23 | Disposition: A | Discharge status: Home / Self Care | Payer: 59 | Source: Ambulatory Visit | Attending: Gynecologic Oncology | Admitting: Gynecologic Oncology

## 2018-12-23 DIAGNOSIS — Z1231 Encounter for screening mammogram for malignant neoplasm of breast: Secondary | ICD-10-CM

## 2018-12-25 ENCOUNTER — Other Ambulatory Visit: Payer: Self-pay | Admitting: Obstetrics & Gynecology

## 2018-12-25 NOTE — Telephone Encounter (Signed)
Medication refill request: Lexapro Last AEX:09/02/15    Next AEX: Patient has been seeing Dr. Denman George Last MMG (if hormonal medication request): 12/23/18  Bi-rads 1 Neg  Refill authorized: #30 with 1 RF

## 2019-01-20 ENCOUNTER — Other Ambulatory Visit: Payer: Self-pay | Admitting: Obstetrics & Gynecology

## 2019-01-20 NOTE — Telephone Encounter (Signed)
Medication refill request: estrace 0.5mg  tablet Last seen 09-17-2016  office visit 11-17-18 for vaginal cancer seeing dr Denman George. rx denied

## 2019-02-26 ENCOUNTER — Other Ambulatory Visit: Payer: Self-pay | Admitting: Obstetrics & Gynecology

## 2019-02-26 NOTE — Telephone Encounter (Signed)
Medication refill request: estradiol  Last AEX:  09/02/15 SM Next AEX: none Last MMG (if hormonal medication request): 12/23/18 BIRADS1:Neg  Refill authorized: 11/13/18 #[id number]. Today please advise

## 2019-03-20 ENCOUNTER — Telehealth: Payer: Self-pay | Admitting: *Deleted

## 2019-03-20 NOTE — Telephone Encounter (Signed)
Attempted to return the patients call, no answer

## 2019-03-20 NOTE — Telephone Encounter (Signed)
Attempted to return the patient's call to schedule an appt in July. No answer

## 2019-05-08 ENCOUNTER — Inpatient Hospital Stay: Payer: 59 | Attending: Gynecologic Oncology | Admitting: Gynecologic Oncology

## 2019-05-08 ENCOUNTER — Other Ambulatory Visit: Payer: Self-pay

## 2019-05-08 ENCOUNTER — Encounter: Payer: Self-pay | Admitting: Gynecologic Oncology

## 2019-05-08 VITALS — BP 123/82 | HR 66 | Temp 98.9°F | Resp 18 | Ht 64.0 in | Wt 137.0 lb

## 2019-05-08 DIAGNOSIS — Z90722 Acquired absence of ovaries, bilateral: Secondary | ICD-10-CM | POA: Diagnosis not present

## 2019-05-08 DIAGNOSIS — Z90710 Acquired absence of both cervix and uterus: Secondary | ICD-10-CM | POA: Diagnosis not present

## 2019-05-08 DIAGNOSIS — C52 Malignant neoplasm of vagina: Secondary | ICD-10-CM

## 2019-05-08 DIAGNOSIS — Z08 Encounter for follow-up examination after completed treatment for malignant neoplasm: Secondary | ICD-10-CM | POA: Diagnosis not present

## 2019-05-08 DIAGNOSIS — Z8544 Personal history of malignant neoplasm of other female genital organs: Secondary | ICD-10-CM

## 2019-05-08 NOTE — Patient Instructions (Addendum)
Please notify Dr Denman George at phone number [phone] if you notice vaginal bleeding, new pelvic or abdominal pains, bloating, feeling full easy, or a change in bladder or bowel function.   Please contact Dr Serita Grit office (at [phone]) in October, 2020 to request an appointment with her for January, 2021. She will perform a pap test at that visit.

## 2019-05-08 NOTE — Progress Notes (Signed)
Follow-up Note: Gyn-Onc  Consult was initially requested by Dr. Sabra Heck for the evaluation of Patricia Gentry 63 y.o. female  CC:  Chief Complaint  Patient presents with  . endometrial cancer    follow-up    Assessment/Plan:  Patricia Gentry  is a 63 y.o.  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  No evidence for recurrence.   I will evaluate her in 6 months.   Next pap smear due December, 2020.  Continue serial exams every 6 months until February, 2023 and annual pap with obligate HPV testing annually. There is no benefit for increasing the frequency of paps unless dysplasia is identified during surveillance.  HPI: Patricia Gentry is a very pleasant 63 year old G0 who is seen in consultation at the request of Dr Sabra Heck for [place].    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 She underwent an abdominal hysterectomy in 1984 for endometriosis. Approximate 4 years following this she began developing vaginal dysplasia. This predominantly low-grade and required minimal intervention. Her last interventional procedure was a vaginal laser for high-grade lesion in 2005. Following that she had a series of ASCUS Pap's with negative high-risk HPV. However in 09/02/2015 she had an ASCUS pap with high-risk HPV identified. This prompted a colposcopic evaluation on 09/15/2015 and biopsies of the anterior vagina. Dr. Sabra Heck notes that she sore a lesion on the anterior wall of the vaginal cuff. 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  The patient denies dyspareunia or vaginal bleeding or abnormal discharge. She is otherwise a very healthy woman who is treated only with estradiol orally and antianxiety medication.  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   On  January 5th, 2017 she underwent a CO2 laser of the anterior vaginal cuff. The procedure was uncomplicated.  Pap in July 2017 showed ASCUS.  Colpo and directed biopsies of a slightly erythematous area at the cuff showed "fragments of reactive inflamed squamous epithelium, negative for dysplasia or malignancy" on 09/20/16.  On 11/02/16 she underwent colposcopic directed biopsies of the anterior and posterior vaginal cuff. The anterior cuff biopsies confirmed focal invasive SCC. There was no grossly visible lesion on inspection and no mass to palpate.  PET/CT on 11/19/16 showed no vaginal or pelvic mass and no distant metastatic disease .(an area seen in the left breast corresponded to a traumatic injury).  On 12/04/16 she underwent robotic assisted upper vaginectomy. The final pathology revealed a 38mm residual focus of squamous cell carcinoma (2.24mm on original biopsy). The margins were widely negative peripherally with a 39mm deep margin (this represents where the bladder was reflected from the upper vagina). There was a small focus of LVSI on the specimen.  10 weeks post op she had intercourse which was painful. She felt a "pop" and had vaginal spotting for 2 days afterwards. Granulation tissue was seen (and biopsy confirmed) on exam.  She has had no bleeding issues since that time. Pap in December, 2018 was benign and HPV negative.   She developed dysphagia (esophageal) - negative EGD, refractory to "stretching".   Pap in November, 2019 was benign with HPV negative  Interval Hx: She is doing well with no complaints or concerns. Not sexually active by choice as she is not married to  her partner.  Current Meds:  Outpatient Encounter Medications as of 05/08/2019  Medication Sig  . Cholecalciferol (VITAMIN D3) 2000 units TABS Take 1 tablet by mouth daily.  Marland Kitchen escitalopram (LEXAPRO) 10 MG tablet TAKE 1 TABLET BY MOUTH ONCE DAILY  . estradiol (ESTRACE) 0.5 MG tablet TAKE 1 TABLET BY MOUTH ONCE DAILY   . ibuprofen (ADVIL,MOTRIN) 200 MG tablet Take 400 mg by mouth every 6 (six) hours as needed for mild pain.  . naproxen sodium (ANAPROX) 220 MG tablet Take 220 mg by mouth daily as needed (PAIN).  Marland Kitchen omeprazole (PRILOSEC) 20 MG capsule Take 1 capsule by mouth daily.  Marland Kitchen senna (SENOKOT) 8.6 MG TABS tablet Take 1 tablet (8.6 mg total) by mouth at bedtime. (Patient taking differently: Take 1 tablet by mouth as needed. )  . vitamin C (ASCORBIC ACID) 500 MG tablet Take 500 mg by mouth daily.   No facility-administered encounter medications on file as of 05/08/2019.     Allergy:  No Known Allergies  Social Hx:   Social History   Socioeconomic History  . Marital status: Divorced    Spouse name: Not on file  . Number of children: Not on file  . Years of education: Not on file  . Highest education level: Not on file  Occupational History  . Not on file  Social Needs  . Financial resource strain: Not on file  . Food insecurity    Worry: Not on file    Inability: Not on file  . Transportation needs    Medical: Not on file    Non-medical: Not on file  Tobacco Use  . Smoking status: Never Smoker  . Smokeless tobacco: Never Used  Substance and Sexual Activity  . Alcohol use: No    Alcohol/week: 0.0 standard drinks  . Drug use: No  . Sexual activity: Yes    Partners: Male    Birth control/protection: Surgical    Comment: LAVH/BSO  Lifestyle  . Physical activity    Days per week: Not on file    Minutes per session: Not on file  . Stress: Not on file  Relationships  . Social Herbalist on phone: Not on file    Gets together: Not on file    Attends religious service: Not on file    Active member of club or organization: Not on file    Attends meetings of clubs or organizations: Not on file    Relationship status: Not on file  . Intimate partner violence    Fear of current or ex partner: Not on file    Emotionally abused: Not on file    Physically abused: Not on file     Forced sexual activity: Not on file  Other Topics Concern  . Not on file  Social History Narrative  . Not on file    Past Surgical Hx:  Past Surgical History:  Procedure Laterality Date  . BREAST BIOPSY    . CERVICAL CONE BIOPSY  mid  1980's  . CO2 LASER APPLICATION N/A 03/20/4402   Procedure: CO2 LASER APPLICATION OF THE VAGINA;  Surgeon: Everitt Amber, MD;  Location: [facility];  Service: Gynecology;  Laterality: N/A;  . LAPAROSCOPIC ASSISTED VAGINAL HYSTERECTOMY  1984   and BSO  . LASER ABLATION VAGINAL DYSPLASIA  2005   high-grade   . ROBOTIC ASSISTED TOTAL HYSTERECTOMY N/A 12/04/2016   Procedure: ROBOTIC ASSISTED UPPER VAGINECTOMY;  Surgeon: Everitt Amber, MD;  Location: [facility];  Service: Gynecology;  Laterality: N/A;  . UPPER GI ENDOSCOPY  04/2018    Past Medical Hx:  Past Medical History:  Diagnosis Date  . Anxiety   . Complication of anesthesia    slow to wake  . Depression   . GERD (gastroesophageal reflux disease)   . High risk HPV infection    per Pap 09-02-2015  . History of cervical dysplasia    CONIZATION 1980'S  . History of condyloma acuminatum    vulva--  2009  . History of vaginal dysplasia    VAIN 2--  LASER ABLATION 2005  . PONV (postoperative nausea and vomiting)   . VAIN III (vaginal intraepithelial neoplasia grade III)   . Wears glasses     Past Gynecological History:  G0, hysterectomy for endometriosis in 1984  Patient's last menstrual period was 10/15/1992.  Family Hx:  Family History  Problem Relation Age of Onset  . Hypertension Mother   . Heart disease Mother   . Hypertension Father   . Heart disease Father   . Hypertension Brother   . Heart disease Brother   . Stroke Brother   . Diabetes Brother        younger brother  . Osteoporosis Maternal Aunt     Review of Systems:  Constitutional  Feels well,    ENT Normal appearing ears and nares bilaterally Skin/Breast  No rash, sores, jaundice, itching,  dryness Cardiovascular  No chest pain, shortness of breath, or edema  Pulmonary  + cough.  Gastro Intestinal  No nausea, vomitting, or diarrhoea. No bright red blood per rectum, no abdominal pain, change in bowel movement, or constipation.  Genito Urinary  No frequency, urgency, dysuria, see HPI Musculo Skeletal  No myalgia, arthralgia, joint swelling or pain  Neurologic  No weakness, numbness, change in gait,  Psychology  No depression, anxiety, insomnia.   Vitals:  Blood pressure 123/82, pulse 66, temperature 98.9 F (37.2 C), temperature source Oral, resp. rate 18, height 5\' 4"  (1.626 m), weight 137 lb (62.1 kg), last menstrual period 10/15/1992, SpO2 98 %.  Physical Exam: WD in NAD Neck  Supple NROM, without any enlargements.  Lymph Node Survey No cervical supraclavicular or inguinal adenopathy Cardiovascular  Pulse normal rate, regularity and rhythm. S1 and S2 normal.  Lungs  Clear to auscultation bilateraly, without wheezes/crackles/rhonchi. Good air movement.  Skin  No rash/lesions/breakdown  Psychiatry  Alert and oriented to person, place, and time  Abdomen  Normoactive bowel sounds, abdomen soft, non-tender and thin without evidence of hernia.  Back No CVA tenderness Genito Urinary  Vulva/vagina: Normal external female genitalia.  No lesions. No discharge or bleeding.  Bladder/urethra:  No lesions or masses, well supported bladder  Vagina: slight hyperemia at vaginal cuff. No nodularity or mass. Pap taken.   Cervix: surgically absent  Uterus: surgically absent.  Adnexa: no palpable masses. Rectal  Good tone, no masses no cul de sac nodularity.  Extremities  No bilateral cyanosis, clubbing or edema    Thereasa Solo, MD  05/08/2019, 1:54 PM

## 2019-08-27 ENCOUNTER — Telehealth: Payer: Self-pay | Admitting: *Deleted

## 2019-08-27 NOTE — Telephone Encounter (Signed)
Patient called and scheduled for a follow up appt in January

## 2019-10-26 ENCOUNTER — Other Ambulatory Visit: Payer: Self-pay

## 2019-10-26 ENCOUNTER — Encounter: Payer: Self-pay | Admitting: Gynecologic Oncology

## 2019-10-26 ENCOUNTER — Inpatient Hospital Stay: Payer: 59 | Attending: Gynecologic Oncology | Admitting: Gynecologic Oncology

## 2019-10-26 ENCOUNTER — Other Ambulatory Visit (HOSPITAL_COMMUNITY)
Admission: RE | Admit: 2019-10-26 | Discharge: 2019-10-26 | Disposition: A | Discharge status: Home / Self Care | Payer: 59 | Source: Ambulatory Visit | Attending: Gynecologic Oncology | Admitting: Gynecologic Oncology

## 2019-10-26 VITALS — BP 144/79 | HR 73 | Temp 98.1°F | Resp 18 | Ht 64.0 in | Wt 139.2 lb

## 2019-10-26 DIAGNOSIS — Z90710 Acquired absence of both cervix and uterus: Secondary | ICD-10-CM | POA: Diagnosis not present

## 2019-10-26 DIAGNOSIS — F329 Major depressive disorder, single episode, unspecified: Secondary | ICD-10-CM | POA: Insufficient documentation

## 2019-10-26 DIAGNOSIS — Z8544 Personal history of malignant neoplasm of other female genital organs: Secondary | ICD-10-CM | POA: Insufficient documentation

## 2019-10-26 DIAGNOSIS — Z08 Encounter for follow-up examination after completed treatment for malignant neoplasm: Secondary | ICD-10-CM | POA: Insufficient documentation

## 2019-10-26 DIAGNOSIS — Z9079 Acquired absence of other genital organ(s): Secondary | ICD-10-CM | POA: Diagnosis not present

## 2019-10-26 DIAGNOSIS — Z79899 Other long term (current) drug therapy: Secondary | ICD-10-CM | POA: Diagnosis not present

## 2019-10-26 DIAGNOSIS — C52 Malignant neoplasm of vagina: Secondary | ICD-10-CM | POA: Insufficient documentation

## 2019-10-26 DIAGNOSIS — A630 Anogenital (venereal) warts: Secondary | ICD-10-CM | POA: Diagnosis not present

## 2019-10-26 DIAGNOSIS — Z90722 Acquired absence of ovaries, bilateral: Secondary | ICD-10-CM | POA: Insufficient documentation

## 2019-10-26 DIAGNOSIS — K219 Gastro-esophageal reflux disease without esophagitis: Secondary | ICD-10-CM | POA: Insufficient documentation

## 2019-10-26 DIAGNOSIS — F419 Anxiety disorder, unspecified: Secondary | ICD-10-CM | POA: Insufficient documentation

## 2019-10-26 NOTE — Progress Notes (Signed)
Follow-up Note: Gyn-Onc  Consult was initially requested by Dr. Sabra Heck for the evaluation of Patricia Gentry 64 y.o. female  CC:  Chief Complaint  Patient presents with  . Vaginal cancer [facility])    Assessment/Plan:  [name]  is a 64 y.o.  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  No evidence for recurrence.   I will evaluate her in 6 months.   Next pap smear due December, 2021.  Continue serial exams every 6 months until February, 2023 and annual pap with obligate HPV testing annually. There is no benefit for increasing the frequency of paps unless dysplasia is identified during surveillance.  HPI: Patricia Gentry is a very pleasant 64 year old G0 who is seen in consultation at the request of Dr Sabra Heck for [place].    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 She underwent an abdominal hysterectomy in 1984 for endometriosis. Approximate 4 years following this she began developing vaginal dysplasia. This predominantly low-grade and required minimal intervention. Her last interventional procedure was a vaginal laser for high-grade lesion in 2005. Following that she had a series of ASCUS Pap's with negative high-risk HPV. However in 09/02/2015 she had an ASCUS pap with high-risk HPV identified. This prompted a colposcopic evaluation on 09/15/2015 and biopsies of the anterior vagina. Dr. Sabra Heck notes that she sore a lesion on the anterior wall of the vaginal cuff. 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  The patient denies dyspareunia or vaginal bleeding or abnormal discharge. She is otherwise a very healthy woman who is treated only with estradiol orally and antianxiety medication.  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   On January 5th,  2017 she underwent a CO2 laser of the anterior vaginal cuff. The procedure was uncomplicated.  Pap in July 2017 showed ASCUS.  Colpo and directed biopsies of a slightly erythematous area at the cuff showed "fragments of reactive inflamed squamous epithelium, negative for dysplasia or malignancy" on 09/20/16.  On 11/02/16 she underwent colposcopic directed biopsies of the anterior and posterior vaginal cuff. The anterior cuff biopsies confirmed focal invasive SCC. There was no grossly visible lesion on inspection and no mass to palpate.  PET/CT on 11/19/16 showed no vaginal or pelvic mass and no distant metastatic disease .(an area seen in the left breast corresponded to a traumatic injury).  On 12/04/16 she underwent robotic assisted upper vaginectomy. The final pathology revealed a 37mm residual focus of squamous cell carcinoma (2.79mm on original biopsy). The margins were widely negative peripherally with a 29mm deep margin (this represents where the bladder was reflected from the upper vagina). There was a small focus of LVSI on the specimen.  10 weeks post op she had intercourse which was painful. She felt a "pop" and had vaginal spotting for 2 days afterwards. Granulation tissue was seen (and biopsy confirmed) on exam.  She has had no bleeding issues since that time. Pap in December, 2018 was benign and HPV negative.   She developed dysphagia (esophageal) - negative EGD, refractory to "stretching".   Pap in November, 2019 was benign with HPV negative  Interval Hx: She is doing well with no complaints or concerns. Not sexually active by choice as she is not married to her partner.  She has a new job.   Current Meds:  Outpatient Encounter Medications as of 10/26/2019  Medication Sig  . Cholecalciferol (VITAMIN D3) 2000 units TABS Take 1 tablet by mouth daily.  Marland Kitchen escitalopram (LEXAPRO) 10 MG tablet TAKE 1 TABLET BY MOUTH ONCE DAILY  . estradiol (ESTRACE) 0.5 MG tablet TAKE 1 TABLET BY MOUTH  ONCE DAILY  . ibuprofen (ADVIL,MOTRIN) 200 MG tablet Take 400 mg by mouth every 6 (six) hours as needed for mild pain.  . naproxen sodium (ANAPROX) 220 MG tablet Take 220 mg by mouth daily as needed (PAIN).  Marland Kitchen omeprazole (PRILOSEC) 20 MG capsule Take 1 capsule by mouth daily.  Marland Kitchen senna (SENOKOT) 8.6 MG TABS tablet Take 1 tablet (8.6 mg total) by mouth at bedtime. (Patient taking differently: Take 1 tablet by mouth as needed. )  . vitamin C (ASCORBIC ACID) 500 MG tablet Take 500 mg by mouth daily.   No facility-administered encounter medications on file as of 10/26/2019.    Allergy:  No Known Allergies  Social Hx:   Social History   Socioeconomic History  . Marital status: Divorced    Spouse name: Not on file  . Number of children: Not on file  . Years of education: Not on file  . Highest education level: Not on file  Occupational History  . Not on file  Tobacco Use  . Smoking status: Never Smoker  . Smokeless tobacco: Never Used  Substance and Sexual Activity  . Alcohol use: No    Alcohol/week: 0.0 standard drinks  . Drug use: No  . Sexual activity: Yes    Partners: Male    Birth control/protection: Surgical    Comment: LAVH/BSO  Other Topics Concern  . Not on file  Social History Narrative  . Not on file   Social Determinants of Health   Financial Resource Strain:   . Difficulty of Paying Living Expenses: Not on file  Food Insecurity:   . Worried About Charity fundraiser in the Last Year: Not on file  . Ran Out of Food in the Last Year: Not on file  Transportation Needs:   . Lack of Transportation (Medical): Not on file  . Lack of Transportation (Non-Medical): Not on file  Physical Activity:   . Days of Exercise per Week: Not on file  . Minutes of Exercise per Session: Not on file  Stress:   . Feeling of Stress : Not on file  Social Connections:   . Frequency of Communication with Friends and Family: Not on file  . Frequency of Social Gatherings with Friends  and Family: Not on file  . Attends Religious Services: Not on file  . Active Member of Clubs or Organizations: Not on file  . Attends Archivist Meetings: Not on file  . Marital Status: Not on file  Intimate Partner Violence:   . Fear of Current or Ex-Partner: Not on file  . Emotionally Abused: Not on file  . Physically Abused: Not on file  . Sexually Abused: Not on file    Past Surgical Hx:  Past Surgical History:  Procedure Laterality Date  . BREAST BIOPSY    . CERVICAL CONE BIOPSY  mid  1980's  . CO2 LASER APPLICATION N/A [id number]   Procedure: CO2 LASER APPLICATION OF THE VAGINA;  Surgeon: Everitt Amber, MD;  Location: [facility];  Service: Gynecology;  Laterality: N/A;  . LAPAROSCOPIC ASSISTED VAGINAL HYSTERECTOMY  1984   and BSO  . LASER ABLATION  VAGINAL DYSPLASIA  2005   high-grade   . ROBOTIC ASSISTED TOTAL HYSTERECTOMY N/A 12/04/2016   Procedure: ROBOTIC ASSISTED UPPER VAGINECTOMY;  Surgeon: Everitt Amber, MD;  Location: [facility];  Service: Gynecology;  Laterality: N/A;  . UPPER GI ENDOSCOPY  04/2018    Past Medical Hx:  Past Medical History:  Diagnosis Date  . Anxiety   . Complication of anesthesia    slow to wake  . Depression   . GERD (gastroesophageal reflux disease)   . High risk HPV infection    per Pap 09-02-2015  . History of cervical dysplasia    CONIZATION 1980'S  . History of condyloma acuminatum    vulva--  2009  . History of vaginal dysplasia    VAIN 2--  LASER ABLATION 2005  . PONV (postoperative nausea and vomiting)   . VAIN III (vaginal intraepithelial neoplasia grade III)   . Wears glasses     Past Gynecological History:  G0, hysterectomy for endometriosis in 1984  Patient's last menstrual period was 10/15/1992.  Family Hx:  Family History  Problem Relation Age of Onset  . Hypertension Mother   . Heart disease Mother   . Hypertension Father   . Heart disease Father   . Hypertension Brother   . Heart disease Brother    . Stroke Brother   . Diabetes Brother        younger brother  . Osteoporosis Maternal Aunt     Review of Systems:  Constitutional  Feels well,    ENT Normal appearing ears and nares bilaterally Skin/Breast  No rash, sores, jaundice, itching, dryness Cardiovascular  No chest pain, shortness of breath, or edema  Pulmonary  + cough.  Gastro Intestinal  No nausea, vomitting, or diarrhoea. No bright red blood per rectum, no abdominal pain, change in bowel movement, or constipation.  Genito Urinary  No frequency, urgency, dysuria, see HPI Musculo Skeletal  No myalgia, arthralgia, joint swelling or pain  Neurologic  No weakness, numbness, change in gait,  Psychology  No depression, anxiety, insomnia.   Vitals:  Blood pressure (!) 144/79, pulse 73, temperature 98.1 F (36.7 C), temperature source Temporal, resp. rate 18, height 5\' 4"  (1.626 m), weight 139 lb 3.2 oz (63.1 kg), last menstrual period 10/15/1992, SpO2 98 %.  Physical Exam: WD in NAD Neck  Supple NROM, without any enlargements.  Lymph Node Survey No cervical supraclavicular or inguinal adenopathy Cardiovascular  Pulse normal rate, regularity and rhythm. S1 and S2 normal.  Lungs  Clear to auscultation bilateraly, without wheezes/crackles/rhonchi. Good air movement.  Skin  No rash/lesions/breakdown  Psychiatry  Alert and oriented to person, place, and time  Abdomen  Normoactive bowel sounds, abdomen soft, non-tender and thin without evidence of hernia.  Back No CVA tenderness Genito Urinary  Vulva/vagina: Normal external female genitalia.  No lesions. No discharge or bleeding.  Bladder/urethra:  No lesions or masses, well supported bladder  Vagina: slight hyperemia at vaginal cuff. No nodularity or mass. Pap taken.   Cervix: surgically absent  Uterus: surgically absent.  Adnexa: no palpable masses. Rectal  Good tone, no masses no cul de sac nodularity.  Extremities  No bilateral cyanosis, clubbing or  edema    Thereasa Solo, MD  10/26/2019, 2:55 PM

## 2019-10-26 NOTE — Patient Instructions (Signed)
Dr Denman George will contact you with your pap test.   Please contact Dr Serita Grit office (at [phone]) in April to request an appointment with her for July, 2021.

## 2019-10-29 LAB — CYTOLOGY - PAP
Comment: NEGATIVE
Diagnosis: NEGATIVE
High risk HPV: NEGATIVE

## 2020-01-20 ENCOUNTER — Other Ambulatory Visit: Payer: Self-pay | Admitting: Obstetrics & Gynecology

## 2020-01-28 ENCOUNTER — Other Ambulatory Visit: Payer: Self-pay | Admitting: Obstetrics & Gynecology

## 2020-02-04 ENCOUNTER — Other Ambulatory Visit: Payer: Self-pay

## 2020-02-04 ENCOUNTER — Other Ambulatory Visit: Payer: Self-pay | Admitting: Obstetrics & Gynecology

## 2020-02-04 NOTE — Telephone Encounter (Signed)
Patient is calling to check status of refill for Lexapro. Patient stated the pharmacy has been faxing over request for 2 weeks and patient has been out of medication for 1 week.

## 2020-02-04 NOTE — Telephone Encounter (Signed)
Medication refill request: Lexapro Last AEX:09/02/15    Next AEX: Patient has been seeing Dr. Denman George Last MMG (if hormonal medication request): 12/23/18  Bi-rads 1 Neg  Refill authorized: #[id number] with 12 RF pended.   Please advise and refill if appropriate.

## 2020-02-09 ENCOUNTER — Encounter: Payer: Self-pay | Admitting: Neurology

## 2020-02-09 MED ORDER — ESCITALOPRAM OXALATE 10 MG PO TABS
10.0000 mg | ORAL_TABLET | Freq: Every day | ORAL | 12 refills | Status: DC
Start: 2020-02-09 — End: 2021-03-03

## 2020-03-16 ENCOUNTER — Other Ambulatory Visit: Payer: Self-pay | Admitting: Obstetrics & Gynecology

## 2020-03-16 NOTE — Telephone Encounter (Signed)
Medication refill request: Estradiol Last AEX:  09/02/15 SM Next AEX: none scheduled - patient had been seeing Dr. Denman George Last MMG (if hormonal medication request): 12/23/18 BIRADS 1 negative/density c Refill authorized: Please advise on refill

## 2020-04-21 ENCOUNTER — Ambulatory Visit: Payer: 59 | Admitting: Neurology

## 2020-04-26 ENCOUNTER — Other Ambulatory Visit: Payer: Self-pay | Admitting: Gynecologic Oncology

## 2020-04-26 DIAGNOSIS — Z1231 Encounter for screening mammogram for malignant neoplasm of breast: Secondary | ICD-10-CM

## 2020-04-27 ENCOUNTER — Ambulatory Visit: Payer: 59 | Admitting: Gynecologic Oncology

## 2020-05-02 ENCOUNTER — Inpatient Hospital Stay: Payer: BC Managed Care – PPO | Attending: Gynecologic Oncology | Admitting: Gynecologic Oncology

## 2020-05-02 ENCOUNTER — Encounter: Payer: Self-pay | Admitting: Gynecologic Oncology

## 2020-05-02 ENCOUNTER — Other Ambulatory Visit: Payer: Self-pay

## 2020-05-02 VITALS — BP 152/77 | HR 66 | Temp 98.2°F | Resp 17 | Ht 64.0 in | Wt 134.3 lb

## 2020-05-02 DIAGNOSIS — Z791 Long term (current) use of non-steroidal anti-inflammatories (NSAID): Secondary | ICD-10-CM | POA: Diagnosis not present

## 2020-05-02 DIAGNOSIS — Z90722 Acquired absence of ovaries, bilateral: Secondary | ICD-10-CM | POA: Insufficient documentation

## 2020-05-02 DIAGNOSIS — F329 Major depressive disorder, single episode, unspecified: Secondary | ICD-10-CM | POA: Insufficient documentation

## 2020-05-02 DIAGNOSIS — Z923 Personal history of irradiation: Secondary | ICD-10-CM | POA: Diagnosis not present

## 2020-05-02 DIAGNOSIS — Z9079 Acquired absence of other genital organ(s): Secondary | ICD-10-CM | POA: Insufficient documentation

## 2020-05-02 DIAGNOSIS — C52 Malignant neoplasm of vagina: Secondary | ICD-10-CM | POA: Insufficient documentation

## 2020-05-02 DIAGNOSIS — Z79899 Other long term (current) drug therapy: Secondary | ICD-10-CM | POA: Diagnosis not present

## 2020-05-02 DIAGNOSIS — F419 Anxiety disorder, unspecified: Secondary | ICD-10-CM | POA: Diagnosis not present

## 2020-05-02 DIAGNOSIS — K219 Gastro-esophageal reflux disease without esophagitis: Secondary | ICD-10-CM | POA: Diagnosis not present

## 2020-05-02 DIAGNOSIS — Z90710 Acquired absence of both cervix and uterus: Secondary | ICD-10-CM | POA: Insufficient documentation

## 2020-05-02 NOTE — Progress Notes (Signed)
Follow-up Note: Gyn-Onc  Consult was initially requested by Dr. Sabra Heck for the evaluation of Alasia Enge 64 y.o. female  CC:  Chief Complaint  Patient presents with  . Vaginal cancer [facility])    Assessment/Plan:  Ms. Shericka Johnstone  is a 64 y.o.  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  No evidence for recurrence.   I will evaluate her in 6 months.   Next pap smear due January, 2022.  Continue serial exams every 6 months until February, 2023 and annual pap with obligate HPV testing annually. There is no benefit for increasing the frequency of paps unless dysplasia is identified during surveillance.  HPI: Marella Vanderpol is a very pleasant 64 year old G0 who is seen in consultation at the request of Dr Sabra Heck for [place].    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 She underwent an abdominal hysterectomy in 1984 for endometriosis. Approximate 4 years following this she began developing vaginal dysplasia. This predominantly low-grade and required minimal intervention. Her last interventional procedure was a vaginal laser for high-grade lesion in 2005. Following that she had a series of ASCUS Pap's with negative high-risk HPV. However in 09/02/2015 she had an ASCUS pap with high-risk HPV identified. This prompted a colposcopic evaluation on 09/15/2015 and biopsies of the anterior vagina. Dr. Sabra Heck notes that she sore a lesion on the anterior wall of the vaginal cuff. 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  The patient denies dyspareunia or vaginal bleeding or abnormal discharge. She is otherwise a very healthy woman who is treated only with estradiol orally and antianxiety medication.  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   On January 5th,  2017 she underwent a CO2 laser of the anterior vaginal cuff. The procedure was uncomplicated.  Pap in July 2017 showed ASCUS.  Colpo and directed biopsies of a slightly erythematous area at the cuff showed "fragments of reactive inflamed squamous epithelium, negative for dysplasia or malignancy" on 09/20/16.  On 11/02/16 she underwent colposcopic directed biopsies of the anterior and posterior vaginal cuff. The anterior cuff biopsies confirmed focal invasive SCC. There was no grossly visible lesion on inspection and no mass to palpate.  PET/CT on 11/19/16 showed no vaginal or pelvic mass and no distant metastatic disease .(an area seen in the left breast corresponded to a traumatic injury).  On 12/04/16 she underwent robotic assisted upper vaginectomy. The final pathology revealed a 32mm residual focus of squamous cell carcinoma (2.14mm on original biopsy). The margins were widely negative peripherally with a 29mm deep margin (this represents where the bladder was reflected from the upper vagina). There was a small focus of LVSI on the specimen.  10 weeks post op she had intercourse which was painful. She felt a "pop" and had vaginal spotting for 2 days afterwards. Granulation tissue was seen (and biopsy confirmed) on exam.  She has had no bleeding issues since that time. Pap in December, 2018 was benign and HPV negative.   She developed dysphagia (esophageal) - negative EGD, refractory to "stretching".   Pap in November, 2019 was benign with HPV negative  Pap in January, 2021 was benign with HPV negative.  Interval Hx: She is doing well with no complaints or concerns. Not sexually active by  choice as she is not married to her partner.  Current Meds:  Outpatient Encounter Medications as of 05/02/2020  Medication Sig  . Cholecalciferol (VITAMIN D3) 2000 units TABS Take 1 tablet by mouth daily.  Marland Kitchen escitalopram (LEXAPRO) 10 MG tablet Take 1 tablet (10 mg total) by mouth daily.  Marland Kitchen estradiol  (ESTRACE) 0.5 MG tablet TAKE 1 TABLET BY MOUTH ONCE DAILY  . ibuprofen (ADVIL,MOTRIN) 200 MG tablet Take 400 mg by mouth every 6 (six) hours as needed for mild pain.  . naproxen sodium (ANAPROX) 220 MG tablet Take 220 mg by mouth daily as needed (PAIN).  Marland Kitchen senna (SENOKOT) 8.6 MG TABS tablet Take 1 tablet (8.6 mg total) by mouth at bedtime. (Patient taking differently: Take 1 tablet by mouth as needed. )  . [DISCONTINUED] omeprazole (PRILOSEC) 20 MG capsule Take 1 capsule by mouth daily. (Patient not taking: Reported on 05/02/2020)  . [DISCONTINUED] vitamin C (ASCORBIC ACID) 500 MG tablet Take 500 mg by mouth daily. (Patient not taking: Reported on 05/02/2020)   No facility-administered encounter medications on file as of 05/02/2020.    Allergy:  No Known Allergies  Social Hx:   Social History   Socioeconomic History  . Marital status: Divorced    Spouse name: Not on file  . Number of children: Not on file  . Years of education: Not on file  . Highest education level: Not on file  Occupational History  . Not on file  Tobacco Use  . Smoking status: Never Smoker  . Smokeless tobacco: Never Used  Vaping Use  . Vaping Use: Never used  Substance and Sexual Activity  . Alcohol use: No    Alcohol/week: 0.0 standard drinks  . Drug use: No  . Sexual activity: Yes    Partners: Male    Birth control/protection: Surgical    Comment: LAVH/BSO  Other Topics Concern  . Not on file  Social History Narrative  . Not on file   Social Determinants of Health   Financial Resource Strain:   . Difficulty of Paying Living Expenses:   Food Insecurity:   . Worried About Charity fundraiser in the Last Year:   . Arboriculturist in the Last Year:   Transportation Needs:   . Film/video editor (Medical):   Marland Kitchen Lack of Transportation (Non-Medical):   Physical Activity:   . Days of Exercise per Week:   . Minutes of Exercise per Session:   Stress:   . Feeling of Stress :   Social Connections:    . Frequency of Communication with Friends and Family:   . Frequency of Social Gatherings with Friends and Family:   . Attends Religious Services:   . Active Member of Clubs or Organizations:   . Attends Archivist Meetings:   Marland Kitchen Marital Status:   Intimate Partner Violence:   . Fear of Current or Ex-Partner:   . Emotionally Abused:   Marland Kitchen Physically Abused:   . Sexually Abused:     Past Surgical Hx:  Past Surgical History:  Procedure Laterality Date  . BREAST BIOPSY    . CERVICAL CONE BIOPSY  mid  1980's  . CO2 LASER APPLICATION N/A 04/22/4695   Procedure: CO2 LASER APPLICATION OF THE VAGINA;  Surgeon: Everitt Amber, MD;  Location: [facility];  Service: Gynecology;  Laterality: N/A;  . LAPAROSCOPIC ASSISTED VAGINAL HYSTERECTOMY  1984   and BSO  . LASER ABLATION VAGINAL DYSPLASIA  2005   high-grade   .  ROBOTIC ASSISTED TOTAL HYSTERECTOMY N/A 12/04/2016   Procedure: ROBOTIC ASSISTED UPPER VAGINECTOMY;  Surgeon: Everitt Amber, MD;  Location: [facility];  Service: Gynecology;  Laterality: N/A;  . UPPER GI ENDOSCOPY  04/2018    Past Medical Hx:  Past Medical History:  Diagnosis Date  . Anxiety   . Complication of anesthesia    slow to wake  . Depression   . GERD (gastroesophageal reflux disease)   . High risk HPV infection    per Pap 09-02-2015  . History of cervical dysplasia    CONIZATION 1980'S  . History of condyloma acuminatum    vulva--  2009  . History of vaginal dysplasia    VAIN 2--  LASER ABLATION 2005  . PONV (postoperative nausea and vomiting)   . VAIN III (vaginal intraepithelial neoplasia grade III)   . Wears glasses     Past Gynecological History:  G0, hysterectomy for endometriosis in 1984  Patient's last menstrual period was 10/15/1992.  Family Hx:  Family History  Problem Relation Age of Onset  . Hypertension Mother   . Heart disease Mother   . Hypertension Father   . Heart disease Father   . Hypertension Brother   . Heart disease  Brother   . Stroke Brother   . Diabetes Brother        younger brother  . Osteoporosis Maternal Aunt     Review of Systems:  Constitutional  Feels well,    ENT Normal appearing ears and nares bilaterally Skin/Breast  No rash, sores, jaundice, itching, dryness Cardiovascular  No chest pain, shortness of breath, or edema  Pulmonary  + cough.  Gastro Intestinal  No nausea, vomitting, or diarrhoea. No bright red blood per rectum, no abdominal pain, change in bowel movement, or constipation.  Genito Urinary  No frequency, urgency, dysuria, see HPI Musculo Skeletal  No myalgia, arthralgia, joint swelling or pain  Neurologic  No weakness, numbness, change in gait,  Psychology  No depression, anxiety, insomnia.   Vitals:  Blood pressure (!) 152/77, pulse 66, temperature 98.2 F (36.8 C), temperature source Oral, resp. rate 17, height 5\' 4"  (1.626 m), weight 134 lb 4.8 oz (60.9 kg), last menstrual period 10/15/1992, SpO2 99 %.  Physical Exam: WD in NAD Neck  Supple NROM, without any enlargements.  Lymph Node Survey No cervical supraclavicular or inguinal adenopathy Cardiovascular  Pulse normal rate, regularity and rhythm. S1 and S2 normal.  Lungs  Clear to auscultation bilateraly, without wheezes/crackles/rhonchi. Good air movement.  Skin  No rash/lesions/breakdown  Psychiatry  Alert and oriented to person, place, and time  Abdomen  Normoactive bowel sounds, abdomen soft, non-tender and thin without evidence of hernia.  Back No CVA tenderness Genito Urinary  Vulva/vagina: Normal external female genitalia.  No lesions. No discharge or bleeding.  Bladder/urethra:  No lesions or masses, well supported bladder  Vagina: slight hyperemia at vaginal cuff. No nodularity or mass. Pap taken.   Cervix: surgically absent  Uterus: surgically absent.  Adnexa: no palpable masses. Rectal  Good tone, no masses no cul de sac nodularity.  Extremities  No bilateral cyanosis, clubbing  or edema    Thereasa Solo, MD  05/02/2020, 3:30 PM

## 2020-05-02 NOTE — Patient Instructions (Signed)
Please notify Dr Denman George at phone number [phone] if you notice vaginal bleeding, new pelvic or abdominal pains, bloating, feeling full easy, or a change in bladder or bowel function.   Please contact Dr Serita Grit office (at [phone]) in October to request an appointment with her for January, 2022.

## 2020-05-03 ENCOUNTER — Ambulatory Visit
Admission: RE | Admit: 2020-05-03 | Discharge: 2020-05-03 | Disposition: A | Discharge status: Home / Self Care | Payer: BC Managed Care – PPO | Source: Ambulatory Visit | Attending: Gynecologic Oncology | Admitting: Gynecologic Oncology

## 2020-05-03 DIAGNOSIS — Z1231 Encounter for screening mammogram for malignant neoplasm of breast: Secondary | ICD-10-CM

## 2020-05-05 ENCOUNTER — Other Ambulatory Visit: Payer: Self-pay | Admitting: Gynecologic Oncology

## 2020-05-05 DIAGNOSIS — R928 Other abnormal and inconclusive findings on diagnostic imaging of breast: Secondary | ICD-10-CM

## 2020-05-16 ENCOUNTER — Ambulatory Visit
Admission: RE | Admit: 2020-05-16 | Discharge: 2020-05-16 | Disposition: A | Discharge status: Home / Self Care | Payer: BC Managed Care – PPO | Source: Ambulatory Visit | Attending: Gynecologic Oncology | Admitting: Gynecologic Oncology

## 2020-05-16 ENCOUNTER — Other Ambulatory Visit: Payer: Self-pay

## 2020-05-16 ENCOUNTER — Ambulatory Visit: Payer: BC Managed Care – PPO

## 2020-05-16 DIAGNOSIS — R928 Other abnormal and inconclusive findings on diagnostic imaging of breast: Secondary | ICD-10-CM

## 2020-06-14 ENCOUNTER — Telehealth: Payer: Self-pay | Admitting: *Deleted

## 2020-06-14 NOTE — Telephone Encounter (Signed)
Patient is in [facility] hold for recall from screening MMG at [facility] on 05/03/20.  Left breast Dx MMG completed on 05/16/20 for left breast asymmetry.   IMPRESSION: No mammographic evidence of malignancy.  RECOMMENDATION: Screening mammogram in one year.(Code:[id number])  I have discussed the findings and recommendations with the patient. If applicable, a reminder letter will be sent to the patient regarding the next appointment.  BI-RADS CATEGORY  1: Negative.   Patient removed from MMG hold.   Routing to provider for final review.

## 2020-07-07 NOTE — Progress Notes (Deleted)
NEUROLOGY CONSULTATION NOTE  Patricia Gentry MRN: [id number] DOB: [birth year]  Referring provider: Dian Situ, MD Primary care provider: Dian Situ, MD  Reason for consult:  Bilateral leg weakness  HISTORY OF PRESENT ILLNESS: Patricia Gentry is a 64 year old ***-handed female who presents for bilateral leg weakness.  History supplemented by referring provider's note.  Myalgias, sciatica.  Statin.  Covid in January.  PCP ordered MRI of lumbar spine which was denied by her insurance.    Labs from March include normal CMP, negative ANA and ds-DNA, CRP 1.4, mildly elevated CK 321  PAST MEDICAL HISTORY: Past Medical History:  Diagnosis Date   Anxiety    Complication of anesthesia    slow to wake   Depression    GERD (gastroesophageal reflux disease)    High risk HPV infection    per Pap 09-02-2015   History of cervical dysplasia    CONIZATION 1980'S   History of condyloma acuminatum    vulva--  2009   History of vaginal dysplasia    VAIN 2--  LASER ABLATION 2005   PONV (postoperative nausea and vomiting)    VAIN III (vaginal intraepithelial neoplasia grade III)    Wears glasses     PAST SURGICAL HISTORY: Past Surgical History:  Procedure Laterality Date   BREAST BIOPSY     needle bx only   CERVICAL CONE BIOPSY  mid  8502'D   CO2 LASER APPLICATION N/A 04/17/1286   Procedure: CO2 LASER APPLICATION OF THE VAGINA;  Surgeon: Everitt Amber, MD;  Location: [facility];  Service: Gynecology;  Laterality: N/A;   LAPAROSCOPIC ASSISTED VAGINAL HYSTERECTOMY  1984   and BSO   LASER ABLATION VAGINAL DYSPLASIA  2005   high-grade    ROBOTIC ASSISTED TOTAL HYSTERECTOMY N/A 12/04/2016   Procedure: ROBOTIC ASSISTED UPPER VAGINECTOMY;  Surgeon: Everitt Amber, MD;  Location: [facility];  Service: Gynecology;  Laterality: N/A;   UPPER GI ENDOSCOPY  04/2018    MEDICATIONS: Current Outpatient Medications on File Prior to Visit  Medication Sig Dispense Refill    Cholecalciferol (VITAMIN D3) 2000 units TABS Take 1 tablet by mouth daily.     escitalopram (LEXAPRO) 10 MG tablet Take 1 tablet (10 mg total) by mouth daily. 30 tablet 12   estradiol (ESTRACE) 0.5 MG tablet TAKE 1 TABLET BY MOUTH ONCE DAILY 90 tablet 3   ibuprofen (ADVIL,MOTRIN) 200 MG tablet Take 400 mg by mouth every 6 (six) hours as needed for mild pain.     naproxen sodium (ANAPROX) 220 MG tablet Take 220 mg by mouth daily as needed (PAIN).     senna (SENOKOT) 8.6 MG TABS tablet Take 1 tablet (8.6 mg total) by mouth at bedtime. (Patient taking differently: Take 1 tablet by mouth as needed. ) 120 each 0   No current facility-administered medications on file prior to visit.    ALLERGIES: No Known Allergies  FAMILY HISTORY: Family History  Problem Relation Age of Onset   Hypertension Mother    Heart disease Mother    Hypertension Father    Heart disease Father    Hypertension Brother    Heart disease Brother    Stroke Brother    Diabetes Brother        younger brother   Osteoporosis Maternal Aunt    ***.  SOCIAL HISTORY: Social History   Socioeconomic History   Marital status: Divorced    Spouse name: Not on file   Number of children: Not on file  Years of education: Not on file   Highest education level: Not on file  Occupational History   Not on file  Tobacco Use   Smoking status: Never Smoker   Smokeless tobacco: Never Used  Vaping Use   Vaping Use: Never used  Substance and Sexual Activity   Alcohol use: No    Alcohol/week: 0.0 standard drinks   Drug use: No   Sexual activity: Yes    Partners: Male    Birth control/protection: Surgical    Comment: LAVH/BSO  Other Topics Concern   Not on file  Social History Narrative   Not on file   Social Determinants of Health   Financial Resource Strain:    Difficulty of Paying Living Expenses: Not on file  Food Insecurity:    Worried About Charity fundraiser in the Last Year: Not  on file   [employer] of Food in the Last Year: Not on file  Transportation Needs:    Lack of Transportation (Medical): Not on file   Lack of Transportation (Non-Medical): Not on file  Physical Activity:    Days of Exercise per Week: Not on file   Minutes of Exercise per Session: Not on file  Stress:    Feeling of Stress : Not on file  Social Connections:    Frequency of Communication with Friends and Family: Not on file   Frequency of Social Gatherings with Friends and Family: Not on file   Attends Religious Services: Not on file   Active Member of [place] or Organizations: Not on file   Attends Archivist Meetings: Not on file   Marital Status: Not on file  Intimate Partner Violence:    Fear of Current or Ex-Partner: Not on file   Emotionally Abused: Not on file   Physically Abused: Not on file   Sexually Abused: Not on file    REVIEW OF SYSTEMS: Constitutional: No fevers, chills, or sweats, no generalized fatigue, change in appetite Eyes: No visual changes, double vision, eye pain Ear, nose and throat: No hearing loss, ear pain, nasal congestion, sore throat Cardiovascular: No chest pain, palpitations Respiratory:  No shortness of breath at rest or with exertion, wheezes GastrointestinaI: No nausea, vomiting, diarrhea, abdominal pain, fecal incontinence Genitourinary:  No dysuria, urinary retention or frequency Musculoskeletal:  No neck pain, back pain Integumentary: No rash, pruritus, skin lesions Neurological: as above Psychiatric: No depression, insomnia, anxiety Endocrine: No palpitations, fatigue, diaphoresis, mood swings, change in appetite, change in weight, increased thirst Hematologic/Lymphatic:  No purpura, petechiae. Allergic/Immunologic: no itchy/runny eyes, nasal congestion, recent allergic reactions, rashes  PHYSICAL EXAM: *** General: No acute distress.  Patient appears ***-groomed.  *** Head:  Normocephalic/atraumatic Eyes:  fundi  examined but not visualized Neck: supple, no paraspinal tenderness, full range of motion Back: No paraspinal tenderness Heart: regular rate and rhythm Lungs: Clear to auscultation bilaterally. Vascular: No carotid bruits. Neurological Exam: Mental status: alert and oriented to person, place, and time, recent and remote memory intact, fund of knowledge intact, attention and concentration intact, speech fluent and not dysarthric, language intact. Cranial nerves: CN I: not tested CN II: pupils equal, round and reactive to light, visual fields intact CN III, IV, VI:  full range of motion, no nystagmus, no ptosis CN V: facial sensation intact CN VII: upper and lower face symmetric CN VIII: hearing intact CN IX, X: gag intact, uvula midline CN XI: sternocleidomastoid and trapezius muscles intact CN XII: tongue midline Bulk & Tone: normal, no fasciculations.  Motor:  5/5 throughout *** Sensation:  Pinprick *** temperature *** and vibration sensation intact.  ***. Deep Tendon Reflexes:  2+ throughout, *** toes downgoing.  *** Finger to nose testing:  Without dysmetria.  *** Heel to shin:  Without dysmetria.  *** Gait:  Normal station and stride.  Able to turn and tandem walk. Romberg ***.  IMPRESSION: ***  PLAN: ***  Thank you for allowing me to take part in the care of this patient.  Metta Clines, DO  CC: ***

## 2020-07-11 ENCOUNTER — Ambulatory Visit: Payer: Self-pay | Admitting: Neurology

## 2020-09-15 ENCOUNTER — Telehealth: Payer: Self-pay | Admitting: *Deleted

## 2020-09-15 NOTE — Telephone Encounter (Signed)
Patient called and scheduled a follow up appt with Dr Rossi  

## 2020-10-17 ENCOUNTER — Encounter: Payer: Self-pay | Admitting: Gynecologic Oncology

## 2020-10-18 ENCOUNTER — Other Ambulatory Visit: Payer: Self-pay

## 2020-10-18 ENCOUNTER — Encounter: Payer: Self-pay | Admitting: Gynecologic Oncology

## 2020-10-18 ENCOUNTER — Other Ambulatory Visit (HOSPITAL_COMMUNITY)
Admission: RE | Admit: 2020-10-18 | Discharge: 2020-10-18 | Disposition: A | Discharge status: Home / Self Care | Payer: Self-pay | Source: Ambulatory Visit | Attending: Gynecologic Oncology | Admitting: Gynecologic Oncology

## 2020-10-18 ENCOUNTER — Inpatient Hospital Stay: Payer: Self-pay | Attending: Gynecologic Oncology | Admitting: Gynecologic Oncology

## 2020-10-18 VITALS — BP 137/84 | HR 90 | Temp 97.8°F | Resp 17 | Ht 64.0 in | Wt 138.1 lb

## 2020-10-18 DIAGNOSIS — C52 Malignant neoplasm of vagina: Secondary | ICD-10-CM | POA: Insufficient documentation

## 2020-10-18 DIAGNOSIS — Z79899 Other long term (current) drug therapy: Secondary | ICD-10-CM | POA: Insufficient documentation

## 2020-10-18 DIAGNOSIS — K219 Gastro-esophageal reflux disease without esophagitis: Secondary | ICD-10-CM | POA: Insufficient documentation

## 2020-10-18 DIAGNOSIS — Z90710 Acquired absence of both cervix and uterus: Secondary | ICD-10-CM | POA: Insufficient documentation

## 2020-10-18 DIAGNOSIS — F419 Anxiety disorder, unspecified: Secondary | ICD-10-CM | POA: Insufficient documentation

## 2020-10-18 DIAGNOSIS — R87810 Cervical high risk human papillomavirus (HPV) DNA test positive: Secondary | ICD-10-CM | POA: Insufficient documentation

## 2020-10-18 DIAGNOSIS — Z90722 Acquired absence of ovaries, bilateral: Secondary | ICD-10-CM | POA: Insufficient documentation

## 2020-10-18 DIAGNOSIS — Z9079 Acquired absence of other genital organ(s): Secondary | ICD-10-CM | POA: Insufficient documentation

## 2020-10-18 DIAGNOSIS — F32A Depression, unspecified: Secondary | ICD-10-CM | POA: Insufficient documentation

## 2020-10-18 NOTE — Progress Notes (Signed)
Follow-up Note: Gyn-Onc  Consult was initially requested by Dr. Sabra Heck for the evaluation of Patricia Gentry 65 y.o. female  CC:  Chief Complaint  Patient presents with  . Vaginal cancer [facility])    Assessment/Plan:  Patricia Gentry  is a 65 y.o.  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  No evidence for recurrence.   I will evaluate her in 6 months.   Next pap smear due January, 2023.  Continue serial exams every 6 months until February, 2023 and annual pap with obligate HPV testing annually. There is no benefit for increasing the frequency of paps unless dysplasia is identified during surveillance.  HPI: Patricia Gentry is a very pleasant 65 year old G0 who is seen in consultation at the request of Dr Sabra Heck for [place].    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 She underwent an abdominal hysterectomy in 1984 for endometriosis. Approximate 4 years following this she began developing vaginal dysplasia. This predominantly low-grade and required minimal intervention. Her last interventional procedure was a vaginal laser for high-grade lesion in 2005. Following that she had a series of ASCUS Pap's with negative high-risk HPV. However in 09/02/2015 she had an ASCUS pap with high-risk HPV identified. This prompted a colposcopic evaluation on 09/15/2015 and biopsies of the anterior vagina. Dr. Sabra Heck notes that she sore a lesion on the anterior wall of the vaginal cuff. 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  The patient denies dyspareunia or vaginal bleeding or abnormal discharge. She is otherwise a very healthy woman who is treated only with estradiol orally and antianxiety medication.  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   On January 5th,  2017 she underwent a CO2 laser of the anterior vaginal cuff. The procedure was uncomplicated.  Pap in July 2017 showed ASCUS.  Colpo and directed biopsies of a slightly erythematous area at the cuff showed "fragments of reactive inflamed squamous epithelium, negative for dysplasia or malignancy" on 09/20/16.  On 11/02/16 she underwent colposcopic directed biopsies of the anterior and posterior vaginal cuff. The anterior cuff biopsies confirmed focal invasive SCC. There was no grossly visible lesion on inspection and no mass to palpate.  PET/CT on 11/19/16 showed no vaginal or pelvic mass and no distant metastatic disease .(an area seen in the left breast corresponded to a traumatic injury).  On 12/04/16 she underwent robotic assisted upper vaginectomy. The final pathology revealed a 69mm residual focus of squamous cell carcinoma (2.8mm on original biopsy). The margins were widely negative peripherally with a 74mm deep margin (this represents where the bladder was reflected from the upper vagina). There was a small focus of LVSI on the specimen.  10 weeks post op she had intercourse which was painful. She felt a "pop" and had vaginal spotting for 2 days afterwards. Granulation tissue was seen (and biopsy confirmed) on exam.  She has had no bleeding issues since that time. Pap in December, 2018 was benign and HPV negative.   She developed dysphagia (esophageal) - negative EGD, refractory to "stretching".   Pap in November, 2019 was benign with HPV negative  Pap in January, 2021 was benign with HPV negative.  Interval Hx: She is doing well with no complaints or concerns. Not sexually active by  choice as she is not married to her partner.  Current Meds:  Outpatient Encounter Medications as of 10/18/2020  Medication Sig  . Cholecalciferol (VITAMIN D3) 2000 units TABS Take 1 tablet by mouth daily.  Marland Kitchen escitalopram (LEXAPRO) 10 MG tablet Take 1 tablet (10 mg total) by mouth daily.  Marland Kitchen estradiol  (ESTRACE) 0.5 MG tablet TAKE 1 TABLET BY MOUTH ONCE DAILY  . ibuprofen (ADVIL,MOTRIN) 200 MG tablet Take 400 mg by mouth every 6 (six) hours as needed for mild pain.  Marland Kitchen senna (SENOKOT) 8.6 MG TABS tablet Take 1 tablet (8.6 mg total) by mouth at bedtime. (Patient taking differently: Take 1 tablet by mouth as needed.)  . [DISCONTINUED] naproxen sodium (ANAPROX) 220 MG tablet Take 220 mg by mouth daily as needed (PAIN).   No facility-administered encounter medications on file as of 10/18/2020.    Allergy:  No Known Allergies  Social Hx:   Social History   Socioeconomic History  . Marital status: Divorced    Spouse name: Not on file  . Number of children: Not on file  . Years of education: Not on file  . Highest education level: Not on file  Occupational History  . Not on file  Tobacco Use  . Smoking status: Never Smoker  . Smokeless tobacco: Never Used  Vaping Use  . Vaping Use: Never used  Substance and Sexual Activity  . Alcohol use: No    Alcohol/week: 0.0 standard drinks  . Drug use: No  . Sexual activity: Yes    Partners: Male    Birth control/protection: Surgical    Comment: LAVH/BSO  Other Topics Concern  . Not on file  Social History Narrative  . Not on file   Social Determinants of Health   Financial Resource Strain: Not on file  Food Insecurity: Not on file  Transportation Needs: Not on file  Physical Activity: Not on file  Stress: Not on file  Social Connections: Not on file  Intimate Partner Violence: Not on file    Past Surgical Hx:  Past Surgical History:  Procedure Laterality Date  . BREAST BIOPSY     needle bx only  . CERVICAL CONE BIOPSY  mid  1980's  . CO2 LASER APPLICATION N/A [id number]   Procedure: CO2 LASER APPLICATION OF THE VAGINA;  Surgeon: Everitt Amber, MD;  Location: [facility];  Service: Gynecology;  Laterality: N/A;  . LAPAROSCOPIC ASSISTED VAGINAL HYSTERECTOMY  1984   and BSO  . LASER ABLATION VAGINAL DYSPLASIA  2005    high-grade   . ROBOTIC ASSISTED TOTAL HYSTERECTOMY N/A 12/04/2016   Procedure: ROBOTIC ASSISTED UPPER VAGINECTOMY;  Surgeon: Everitt Amber, MD;  Location: [facility];  Service: Gynecology;  Laterality: N/A;  . UPPER GI ENDOSCOPY  04/2018    Past Medical Hx:  Past Medical History:  Diagnosis Date  . Anxiety   . Complication of anesthesia    slow to wake  . Depression   . GERD (gastroesophageal reflux disease)   . High risk HPV infection    per Pap 09-02-2015  . History of cervical dysplasia    CONIZATION 1980'S  . History of condyloma acuminatum    vulva--  2009  . History of vaginal dysplasia    VAIN 2--  LASER ABLATION 2005  . PONV (postoperative nausea and vomiting)   . VAIN III (vaginal intraepithelial neoplasia grade III)   . Wears glasses     Past Gynecological History:  G0, hysterectomy for endometriosis in 1984  Patient's  last menstrual period was 10/15/1992.  Family Hx:  Family History  Problem Relation Age of Onset  . Hypertension Mother   . Heart disease Mother   . Hypertension Father   . Heart disease Father   . Hypertension Brother   . Heart disease Brother   . Stroke Brother   . Diabetes Brother        younger brother  . Osteoporosis Maternal Aunt     Review of Systems:  Constitutional  Feels well,    ENT Normal appearing ears and nares bilaterally Skin/Breast  No rash, sores, jaundice, itching, dryness Cardiovascular  No chest pain, shortness of breath, or edema  Pulmonary  + cough.  Gastro Intestinal  No nausea, vomitting, or diarrhoea. No bright red blood per rectum, no abdominal pain, change in bowel movement, or constipation.  Genito Urinary  No frequency, urgency, dysuria, see HPI Musculo Skeletal  No myalgia, arthralgia, joint swelling or pain  Neurologic  No weakness, numbness, change in gait,  Psychology  No depression, anxiety, insomnia.   Vitals:  Blood pressure 137/84, pulse 90, temperature 97.8 F (36.6 C), temperature source  Tympanic, resp. rate 17, height 5\' 4"  (1.626 m), weight 138 lb 1.6 oz (62.6 kg), last menstrual period 10/15/1992, SpO2 98 %.  Physical Exam: WD in NAD Neck  Supple NROM, without any enlargements.  Lymph Node Survey No cervical supraclavicular or inguinal adenopathy Cardiovascular  Pulse normal rate, regularity and rhythm. S1 and S2 normal.  Lungs  Clear to auscultation bilateraly, without wheezes/crackles/rhonchi. Good air movement.  Skin  No rash/lesions/breakdown  Psychiatry  Alert and oriented to person, place, and time  Abdomen  Normoactive bowel sounds, abdomen soft, non-tender and thin without evidence of hernia.  Back No CVA tenderness Genito Urinary  Vulva/vagina: Normal external female genitalia.  No lesions. No discharge or bleeding.  Bladder/urethra:  No lesions or masses, well supported bladder  Vagina: slight hyperemia at vaginal cuff. No nodularity or mass. Pap taken.   Cervix: surgically absent  Uterus: surgically absent.  Adnexa: no palpable masses. Rectal  Good tone, no masses no cul de sac nodularity.  Extremities  No bilateral cyanosis, clubbing or edema    12/13/1992, MD  10/18/2020, 1:55 PM

## 2020-10-18 NOTE — Patient Instructions (Signed)
Please notify Dr Andrey Farmer at phone number [phone] if you notice vaginal bleeding, new pelvic or abdominal pains, bloating, feeling full easy, or a change in bladder or bowel function.   Please contact Dr Oliver Hum office (at [phone]) in or after April to request an appointment with her for June, 2022.

## 2020-10-21 LAB — CYTOLOGY - PAP
Comment: NEGATIVE
Diagnosis: NEGATIVE
High risk HPV: NEGATIVE

## 2020-10-25 ENCOUNTER — Telehealth: Payer: Self-pay

## 2020-10-25 NOTE — Telephone Encounter (Signed)
-----   Message from Dorothyann Gibbs, NP sent at 10/21/2020  1:27 PM EST ----- Can you let her know her pap is normal and HPV high risk is negative.   ----- Message ----- From: Interface, Lab In Three Zero One Sent: 10/21/2020  10:08 AM EST To: Everitt Amber, MD

## 2020-10-25 NOTE — Telephone Encounter (Signed)
Told Ms Mccann the results of the Pap Smear as noted below by Melissa Cross,NP.

## 2020-12-14 ENCOUNTER — Other Ambulatory Visit: Payer: Self-pay | Admitting: Family Medicine

## 2020-12-14 DIAGNOSIS — R29898 Other symptoms and signs involving the musculoskeletal system: Secondary | ICD-10-CM

## 2020-12-30 ENCOUNTER — Ambulatory Visit
Admission: RE | Admit: 2020-12-30 | Discharge: 2020-12-30 | Disposition: A | Discharge status: Home / Self Care | Payer: BC Managed Care – PPO | Source: Ambulatory Visit | Attending: Family Medicine | Admitting: Family Medicine

## 2020-12-30 ENCOUNTER — Other Ambulatory Visit: Payer: Self-pay

## 2020-12-30 DIAGNOSIS — R29898 Other symptoms and signs involving the musculoskeletal system: Secondary | ICD-10-CM

## 2021-01-05 ENCOUNTER — Encounter: Payer: Self-pay | Admitting: Neurology

## 2021-03-01 ENCOUNTER — Other Ambulatory Visit: Payer: Self-pay | Admitting: Obstetrics & Gynecology

## 2021-03-03 ENCOUNTER — Telehealth (HOSPITAL_BASED_OUTPATIENT_CLINIC_OR_DEPARTMENT_OTHER): Payer: Self-pay | Admitting: Obstetrics & Gynecology

## 2021-03-03 NOTE — Telephone Encounter (Signed)
Called pt.  Refill request for lexapro 10mg  came.  Long term patient but I have not seen her since vaginal cancer diagnosis.  Has been followed by Dr. Everitt Amber.  Pt is 4 years from initial diagnosis with all follow up normal.  Has 6 and 12 month follow up planned with Dr. Denman George and then will be released.  She is doing well.  Still on Lexapro and does not want to stop Rx at this time.  Refill done for 1 year.  Plans to come back for care once released from gyn/oncology.

## 2021-03-10 NOTE — Progress Notes (Signed)
NEUROLOGY CONSULTATION NOTE  Hillarie Harrigan MRN: [id number] DOB: 07-16-1956  Referring provider: Steva Colder, MD Primary care provider: Steva Colder, MD  Reason for consult:  Bilateral leg weakness  Assessment/Plan:   1.  Generalized weakness with frequent falls and areflexia - I would like to evaluate for underlying myopathy  1.  Check labs:  CK, aldolase, sed rate, CRP, TSH 2.  Check NCV-EMG 3.  Follow up after testing.  Further recommendations pending results.   Subjective:  Patricia Gentry is a 65 year old right-handed female with vaginal cancer who presents for bilateral leg weakness.  History supplemented by referring provider's note.  Over the past 3 years, she has had an increase in falls.  She states that her legs will just give out.  At first, it was thought to be secondary to statin but it persisted despite discontinuation.  She has resumed taking it.  No numbness, back pain, leg pain, double vision, dysphagia, shortness of breath, or involvement of upper extremities.  She falls about 2 to 3 times a month.  It is difficulty for her to stand up from a seat.  She needs to hold onto the bannister when using the stairs.  No family history of similar symptoms.  Her PCP noted this year that she did not exhibit any reflexes on exam.  She had an MRI of the lumbar spine without contrast on 12/30/2020, which was personally reviewed, and demonstrated multilevel degenerative disease with no significant spinal canal or neural foraminal stenosis.     PAST MEDICAL HISTORY: Past Medical History:  Diagnosis Date  . Anxiety   . Complication of anesthesia    slow to wake  . Depression   . GERD (gastroesophageal reflux disease)   . High risk HPV infection    per Pap 09-02-2015  . History of cervical dysplasia    CONIZATION 1980'S  . History of condyloma acuminatum    vulva--  2009  . History of vaginal dysplasia    VAIN 2--  LASER ABLATION 2005  . PONV (postoperative nausea and  vomiting)   . VAIN III (vaginal intraepithelial neoplasia grade III)   . Wears glasses     PAST SURGICAL HISTORY: Past Surgical History:  Procedure Laterality Date  . BREAST BIOPSY     needle bx only  . CERVICAL CONE BIOPSY  mid  1980's  . CO2 LASER APPLICATION N/A 01/21/1855   Procedure: CO2 LASER APPLICATION OF THE VAGINA;  Surgeon: Everitt Amber, MD;  Location: [facility];  Service: Gynecology;  Laterality: N/A;  . LAPAROSCOPIC ASSISTED VAGINAL HYSTERECTOMY  1984   and BSO  . LASER ABLATION VAGINAL DYSPLASIA  2005   high-grade   . ROBOTIC ASSISTED TOTAL HYSTERECTOMY N/A 12/04/2016   Procedure: ROBOTIC ASSISTED UPPER VAGINECTOMY;  Surgeon: Everitt Amber, MD;  Location: [facility];  Service: Gynecology;  Laterality: N/A;  . UPPER GI ENDOSCOPY  04/2018    MEDICATIONS: Current Outpatient Medications on File Prior to Visit  Medication Sig Dispense Refill  . Cholecalciferol (VITAMIN D3) 2000 units TABS Take 1 tablet by mouth daily.    Marland Kitchen escitalopram (LEXAPRO) 10 MG tablet TAKE ONE TABLET BY MOUTH DAILY 90 tablet 4  . estradiol (ESTRACE) 0.5 MG tablet TAKE 1 TABLET BY MOUTH ONCE DAILY 90 tablet 3  . ibuprofen (ADVIL,MOTRIN) 200 MG tablet Take 400 mg by mouth every 6 (six) hours as needed for mild pain.    Marland Kitchen senna (SENOKOT) 8.6 MG TABS tablet Take 1  tablet (8.6 mg total) by mouth at bedtime. (Patient taking differently: Take 1 tablet by mouth as needed.) 120 each 0   No current facility-administered medications on file prior to visit.    ALLERGIES: No Known Allergies  FAMILY HISTORY: Family History  Problem Relation Age of Onset  . Hypertension Mother   . Heart disease Mother   . Hypertension Father   . Heart disease Father   . Hypertension Brother   . Heart disease Brother   . Stroke Brother   . Diabetes Brother        younger brother  . Osteoporosis Maternal Aunt     Objective:  Blood pressure (!) 165/89, pulse 92, height 5\' 4"  (1.626 m), weight 136 lb 6.4 oz  (61.9 kg), last menstrual period 10/15/1992, SpO2 94 %. General: No acute distress.  Patient appears well-groomed.   Head:  Normocephalic/atraumatic Eyes:  fundi examined but not visualized Neck: supple, no paraspinal tenderness, full range of motion Back: No paraspinal tenderness Heart: regular rate and rhythm Lungs: Clear to auscultation bilaterally. Vascular: No carotid bruits. Neurological Exam: Mental status: alert and oriented to person, place, and time, recent and remote memory intact, fund of knowledge intact, attention and concentration intact, speech fluent and not dysarthric, language intact. Cranial nerves: CN I: not tested CN II: pupils equal, round and reactive to light, visual fields intact CN III, IV, VI:  full range of motion, no nystagmus, no ptosis CN V: facial sensation intact. CN VII: upper and lower face symmetric CN VIII: hearing intact CN IX, X: gag intact, uvula midline CN XI: sternocleidomastoid and trapezius muscles intact CN XII: tongue midline Bulk & Tone: normal, no fasciculations. Motor:  muscle strength 5-/5 bilateral deltoid, 4+ bilateral triceps, 4+/5 bilateral interossei/finger flexion, 5-/5 bilateral hand grip but otherwise 5/5 upper extremities.  3/5 bilateral knee extension, 5-/5 bilateral hip flexion and knee flexion, otherwise 5/5. Sensation:  Pinprick, temperature and vibratory sensation intact. Deep Tendon Reflexes:  absent throughout,  toes downgoing.   Finger to nose testing:  Without dysmetria.   Heel to shin:  Without dysmetria.   Gait:  Normal station and stride.  Unable to tandem walk.  Romberg negative.    Thank you for allowing me to take part in the care of this patient.  Metta Clines, DO  CC: Steva Colder, MD

## 2021-03-14 ENCOUNTER — Other Ambulatory Visit: Payer: Self-pay

## 2021-03-14 ENCOUNTER — Ambulatory Visit (INDEPENDENT_AMBULATORY_CARE_PROVIDER_SITE_OTHER): Payer: BC Managed Care – PPO | Admitting: Neurology

## 2021-03-14 ENCOUNTER — Other Ambulatory Visit (INDEPENDENT_AMBULATORY_CARE_PROVIDER_SITE_OTHER): Payer: BC Managed Care – PPO

## 2021-03-14 ENCOUNTER — Encounter: Payer: Self-pay | Admitting: Neurology

## 2021-03-14 VITALS — BP 165/89 | HR 92 | Ht 64.0 in | Wt 136.4 lb

## 2021-03-14 DIAGNOSIS — R296 Repeated falls: Secondary | ICD-10-CM | POA: Diagnosis not present

## 2021-03-14 DIAGNOSIS — R531 Weakness: Secondary | ICD-10-CM

## 2021-03-14 DIAGNOSIS — R292 Abnormal reflex: Secondary | ICD-10-CM | POA: Diagnosis not present

## 2021-03-14 LAB — SEDIMENTATION RATE: Sed Rate: 38 mm/hr — ABNORMAL HIGH (ref 0–30)

## 2021-03-14 LAB — CK: Total CK: 255 U/L — ABNORMAL HIGH (ref 7–177)

## 2021-03-14 LAB — C-REACTIVE PROTEIN: CRP: 1.7 mg/dL (ref 0.5–20.0)

## 2021-03-14 LAB — TSH: TSH: 2.21 u[IU]/mL (ref 0.35–4.50)

## 2021-03-14 NOTE — Patient Instructions (Addendum)
1.  Check CK, aldolase, sed rate, CRP, TSH.Your provider has requested that you have labwork completed today. Please go to [facility] ([id number]) on the second floor of this building before leaving the office today. You do not need to check in. If you are not called within 15 minutes please check with the front desk.  2.  Check nerve study of right arm and leg 3.  Follow up after testing.

## 2021-03-15 LAB — ALDOLASE: Aldolase: 24 U/L — ABNORMAL HIGH (ref <=8.1)

## 2021-03-16 NOTE — Progress Notes (Signed)
Pt advised of her Lab results. 

## 2021-04-04 ENCOUNTER — Encounter: Payer: BC Managed Care – PPO | Admitting: Neurology

## 2021-04-05 ENCOUNTER — Other Ambulatory Visit: Payer: Self-pay | Admitting: Obstetrics & Gynecology

## 2021-04-13 ENCOUNTER — Encounter: Payer: Self-pay | Admitting: Gynecologic Oncology

## 2021-04-18 ENCOUNTER — Inpatient Hospital Stay: Payer: BC Managed Care – PPO | Attending: Gynecologic Oncology | Admitting: Gynecologic Oncology

## 2021-04-18 ENCOUNTER — Other Ambulatory Visit: Payer: Self-pay

## 2021-04-18 ENCOUNTER — Encounter: Payer: Self-pay | Admitting: Gynecologic Oncology

## 2021-04-18 VITALS — BP 142/76 | HR 71 | Temp 97.5°F | Resp 18 | Ht 64.0 in | Wt 136.2 lb

## 2021-04-18 DIAGNOSIS — Z79899 Other long term (current) drug therapy: Secondary | ICD-10-CM | POA: Diagnosis not present

## 2021-04-18 DIAGNOSIS — F419 Anxiety disorder, unspecified: Secondary | ICD-10-CM | POA: Insufficient documentation

## 2021-04-18 DIAGNOSIS — F32A Depression, unspecified: Secondary | ICD-10-CM | POA: Insufficient documentation

## 2021-04-18 DIAGNOSIS — K219 Gastro-esophageal reflux disease without esophagitis: Secondary | ICD-10-CM | POA: Insufficient documentation

## 2021-04-18 DIAGNOSIS — Z8544 Personal history of malignant neoplasm of other female genital organs: Secondary | ICD-10-CM | POA: Diagnosis not present

## 2021-04-18 DIAGNOSIS — Z9079 Acquired absence of other genital organ(s): Secondary | ICD-10-CM | POA: Diagnosis not present

## 2021-04-18 DIAGNOSIS — C52 Malignant neoplasm of vagina: Secondary | ICD-10-CM

## 2021-04-18 NOTE — Patient Instructions (Signed)
Please notify Dr Denman George at phone number [phone] if you notice vaginal bleeding, new pelvic or abdominal pains, bloating, feeling full easy, or a change in bladder or bowel function.    Please return to see Joylene John, NP in January, 2023. A pap smear will be performed at that time.  If normal, you will graduate from cancer surveillance visits at the cancer center and can follow-up with Dr Sabra Heck for routine gynecologic care.

## 2021-04-18 NOTE — Progress Notes (Signed)
Follow-up Note: Gyn-Onc  Consult was initially requested by Dr. Sabra Heck for the evaluation of Patricia Gentry 65 y.o. female  CC:  Chief Complaint  Patient presents with   Vaginal cancer [facility])    Assessment/Plan:  Patricia Gentry  is a 65 y.o.  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  No evidence for recurrence.   She will return for follow-up in 6 months with my partner, Joylene John, NP as I am leaving the practice in October.  Next pap smear due at that visit in January, 2023.  If she has no evidence of recurrence at that visit, she will be discharged to Dr Sabra Heck for ongoing routine gynecologic care.  HPI: Patricia Gentry is a very pleasant 65 year old G0 who is seen in consultation at the request of Dr Sabra Heck for [place].    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 She underwent an abdominal hysterectomy in 1984 for endometriosis. Approximate 4 years following this she began developing vaginal dysplasia. This predominantly low-grade and required minimal intervention. Her last interventional procedure was a vaginal laser for high-grade lesion in 2005. Following that she had a series of ASCUS Pap's with negative high-risk HPV. However in 09/02/2015 she had an ASCUS pap with high-risk HPV identified. This prompted a colposcopic evaluation on 09/15/2015 and biopsies of the anterior vagina. Dr. Sabra Heck notes that she sore a lesion on the anterior wall of the vaginal cuff. 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  The patient denies dyspareunia or vaginal bleeding or abnormal discharge. She is otherwise a very healthy woman who is treated only with estradiol orally and antianxiety medication.  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   On  January 5th, 2017 she underwent a CO2 laser of the anterior vaginal cuff. The procedure was uncomplicated.  Pap in July 2017 showed ASCUS.  Colpo and directed biopsies of a slightly erythematous area at the cuff showed "fragments of reactive inflamed squamous epithelium, negative for dysplasia or malignancy" on 09/20/16.  On 11/02/16 she underwent colposcopic directed biopsies of the anterior and posterior vaginal cuff. The anterior cuff biopsies confirmed focal invasive SCC. There was no grossly visible lesion on inspection and no mass to palpate.  PET/CT on 11/19/16 showed no vaginal or pelvic mass and no distant metastatic disease .(an area seen in the left breast corresponded to a traumatic injury).  On 12/04/16 she underwent robotic assisted upper vaginectomy. The final pathology revealed a 34mm residual focus of squamous cell carcinoma (2.49mm on original biopsy). The margins were widely negative peripherally with a 50mm deep margin (this represents where the bladder was reflected from the upper vagina). There was a small focus of LVSI on the specimen.  10 weeks post op she had intercourse which was painful. She felt a "pop" and had vaginal spotting for 2 days afterwards. Granulation tissue was seen (and biopsy confirmed) on exam.  She has had no bleeding issues since that time. Pap in December, 2018 was benign and HPV negative.   She developed dysphagia (esophageal) - negative EGD, refractory to "stretching".   Pap in November, 2019 was benign with HPV negative  Pap in January, 2021 was benign with HPV negative.  Interval Hx: She is doing well with no  complaints or concerns. Not sexually active by choice as she is not married to her partner.  Current Meds:  Outpatient Encounter Medications as of 04/18/2021  Medication Sig   Cholecalciferol (VITAMIN D3) 2000 units TABS Take 1 tablet by mouth daily.   escitalopram (LEXAPRO) 10 MG tablet TAKE ONE TABLET BY MOUTH DAILY   estradiol (ESTRACE)  0.5 MG tablet TAKE 1 TABLET BY MOUTH ONCE DAILY   fluticasone (FLONASE) 50 MCG/ACT nasal spray SMARTSIG:2 Both Nares Daily   Multiple Vitamin (MULTIVITAMIN) capsule Take 1 capsule by mouth daily.   senna (SENOKOT) 8.6 MG TABS tablet Take 1 tablet (8.6 mg total) by mouth at bedtime.   [DISCONTINUED] ROSUVASTATIN CALCIUM PO Take 30 mg by mouth at bedtime.   ibuprofen (ADVIL,MOTRIN) 200 MG tablet Take 400 mg by mouth every 6 (six) hours as needed for mild pain. (Patient not taking: Reported on 04/13/2021)   rosuvastatin (CRESTOR) 20 MG tablet Take 20 mg by mouth at bedtime.   No facility-administered encounter medications on file as of 04/18/2021.    Allergy:  No Known Allergies  Social Hx:   Social History   Socioeconomic History   Marital status: Divorced    Spouse name: Not on file   Number of children: Not on file   Years of education: Not on file   Highest education level: Not on file  Occupational History   Not on file  Tobacco Use   Smoking status: Never   Smokeless tobacco: Never  Vaping Use   Vaping Use: Never used  Substance and Sexual Activity   Alcohol use: No    Alcohol/week: 0.0 standard drinks   Drug use: No   Sexual activity: Yes    Partners: Male    Birth control/protection: Surgical    Comment: LAVH/BSO  Other Topics Concern   Not on file  Social History Narrative   Right handed   Social Determinants of Health   Financial Resource Strain: Not on file  Food Insecurity: Not on file  Transportation Needs: Not on file  Physical Activity: Not on file  Stress: Not on file  Social Connections: Not on file  Intimate Partner Violence: Not on file    Past Surgical Hx:  Past Surgical History:  Procedure Laterality Date   BREAST BIOPSY     needle bx only   CERVICAL CONE BIOPSY  mid  2536'U   CO2 LASER APPLICATION N/A 01/16/346   Procedure: CO2 LASER APPLICATION OF THE VAGINA;  Surgeon: Everitt Amber, MD;  Location: [place];  Service:  Gynecology;  Laterality: N/A;   LAPAROSCOPIC ASSISTED VAGINAL HYSTERECTOMY  1984   and BSO   LASER ABLATION VAGINAL DYSPLASIA  2005   high-grade    ROBOTIC ASSISTED TOTAL HYSTERECTOMY N/A 12/04/2016   Procedure: ROBOTIC ASSISTED UPPER VAGINECTOMY;  Surgeon: Everitt Amber, MD;  Location: [facility];  Service: Gynecology;  Laterality: N/A;   UPPER GI ENDOSCOPY  04/2018    Past Medical Hx:  Past Medical History:  Diagnosis Date   Anxiety    Complication of anesthesia    slow to wake   Depression    GERD (gastroesophageal reflux disease)    High risk HPV infection    per Pap 09-02-2015   History of cervical dysplasia    CONIZATION 1980'S   History of condyloma acuminatum    vulva--  2009   History of vaginal dysplasia    VAIN 2--  LASER ABLATION 2005   PONV (postoperative nausea and vomiting)  VAIN III (vaginal intraepithelial neoplasia grade III)    Wears glasses     Past Gynecological History:  G0, hysterectomy for endometriosis in 1984  Patient's last menstrual period was 10/15/1992.  Family Hx:  Family History  Problem Relation Age of Onset   Hypertension Mother    Heart disease Mother    Hypertension Father    Heart disease Father    Hypertension Brother    Heart disease Brother    Stroke Brother    Diabetes Brother        younger brother   Osteoporosis Maternal Aunt     Review of Systems:  Constitutional  Feels well,    ENT Normal appearing ears and nares bilaterally Skin/Breast  No rash, sores, jaundice, itching, dryness Cardiovascular  No chest pain, shortness of breath, or edema  Pulmonary  + cough.  Gastro Intestinal  No nausea, vomitting, or diarrhoea. No bright red blood per rectum, no abdominal pain, change in bowel movement, or constipation.  Genito Urinary  No frequency, urgency, dysuria, see HPI Musculo Skeletal  No myalgia, arthralgia, joint swelling or pain  Neurologic  No weakness, numbness, change in gait,  Psychology  No depression,  anxiety, insomnia.   Vitals:  Blood pressure (!) 142/76, pulse 71, temperature (!) 97.5 F (36.4 C), temperature source Tympanic, resp. rate 18, height 5\' 4"  (1.626 m), weight 136 lb 3.2 oz (61.8 kg), last menstrual period 10/15/1992, SpO2 97 %.  Physical Exam: WD in NAD Neck  Supple NROM, without any enlargements.  Lymph Node Survey No cervical supraclavicular or inguinal adenopathy Cardiovascular  Pulse normal rate, regularity and rhythm. S1 and S2 normal.  Lungs  Clear to auscultation bilateraly, without wheezes/crackles/rhonchi. Good air movement.  Skin  No rash/lesions/breakdown  Psychiatry  Alert and oriented to person, place, and time  Abdomen  Normoactive bowel sounds, abdomen soft, non-tender and thin without evidence of hernia.  Back No CVA tenderness Genito Urinary  Vulva/vagina: Normal external female genitalia.  No lesions. No discharge or bleeding.  Bladder/urethra:  No lesions or masses, well supported bladder  Vagina: slight hyperemia at vaginal cuff. No nodularity or mass. Pap taken.   Cervix: surgically absent  Uterus: surgically absent.  Adnexa: no palpable masses. Rectal  Good tone, no masses no cul de sac nodularity.  Extremities  No bilateral cyanosis, clubbing or edema    Thereasa Solo, MD  04/18/2021, 5:32 PM

## 2021-04-26 ENCOUNTER — Telehealth: Payer: Self-pay | Admitting: Neurology

## 2021-04-26 ENCOUNTER — Ambulatory Visit (INDEPENDENT_AMBULATORY_CARE_PROVIDER_SITE_OTHER): Payer: BC Managed Care – PPO | Admitting: Neurology

## 2021-04-26 ENCOUNTER — Other Ambulatory Visit: Payer: Self-pay

## 2021-04-26 DIAGNOSIS — G729 Myopathy, unspecified: Secondary | ICD-10-CM

## 2021-04-26 DIAGNOSIS — R531 Weakness: Secondary | ICD-10-CM

## 2021-04-26 DIAGNOSIS — R292 Abnormal reflex: Secondary | ICD-10-CM

## 2021-04-26 DIAGNOSIS — R296 Repeated falls: Secondary | ICD-10-CM

## 2021-04-26 NOTE — Procedures (Signed)
[facility]  [address]  [place], [place] [address] Tel: [phone] Fax:  [phone] Test Date:  04/26/2021  Patient: Patricia Gentry DOB: Mar 13, 1956 Physician: Narda Amber, DO  Sex: Female Height: 5\' 4"  Ref Phys: Metta Clines, D.O.  ID#: [id number]   Technician:    Patient Complaints: This is a 65 year old female referred for evaluation of generalized weakness of the arms and legs.  NCV & EMG Findings: Extensive electrodiagnostic testing of the right upper and lower extremity shows:  All sensory responses including the right median, ulnar, mixed palmar, sural, and superficial peroneal sensory responses are within normal limits. Right median and ulnar motor responses are within normal limits.  Of note, there is evidence of a right Martin-Gruber anastomosis, a normal anatomic variant.  Right tibial motor response shows reduced amplitude at the extensor digitorum brevis and tibialis anterior.  Right tibial motor responses within normal limits. Right tibial H reflex study is within normal limits. Small, polyphasic motor unit potentials are seen diffusely in the upper extremities as well as the adductor longus and biceps femoris muscles.  Neurogenic changes are present in the anterior tibialis and gastrocnemius muscles.  Despite maximal activation, no motor unit recruitment was seen in the vastus lateralis, vastus medialis, or iliac [place] muscles.  Fibrillation potentials are present in the upper and lower extremities.  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  Of note, the preferential involvement of hip flexors and finger flexors along with mixed neurogenic and myopathic findings is suggestive of inclusion body myositis.  Correlate clinically.     ___________________________ Narda Amber, DO    Nerve Conduction Studies Anti Sensory Summary Table   Stim Site NR Peak (ms) Norm Peak (ms) P-T  Amp (V) Norm P-T Amp  Right Median Anti Sensory (2nd Digit)  32C  Wrist    3.3 <3.8 25.5 >10  Right Sup Peroneal Anti Sensory (Ant Lat Mall)  32C  12 cm    2.1 <4.6 12.5 >3  Right Sural Anti Sensory (Lat Mall)  32C  Calf    3.3 <4.6 16.1 >3  Right Ulnar Anti Sensory (5th Digit)  32C  Wrist    3.0 <3.2 23.8 >5   Motor Summary Table   Stim Site NR Onset (ms) Norm Onset (ms) O-P Amp (mV) Norm O-P Amp Site1 Site2 Delta-0 (ms) Dist (cm) Vel (m/s) Norm Vel (m/s)  Right Median Motor (Abd Poll Brev)  32C  Wrist    3.4 <4.0 6.2 >5 Elbow Wrist 5.0 28.0 56 >50  Elbow    8.4  7.2  Ulnar-wrist crossover Elbow 4.1 0.0    Ulnar-wrist crossover    4.3  8.3         Right Peroneal Motor (Ext Dig Brev)  32C  Ankle    3.4 <6.0 1.0 >2.5 B Fib Ankle 7.8 35.0 45 >40  B Fib    11.2  1.0  Poplt B Fib 1.5 8.0 53 >40  Poplt    12.7  0.9         Right Peroneal TA Motor (Tib Ant)  32C  Fib Head    2.6 <4.5 2.8 >3 Poplit Fib Head 1.4 8.0 57 >40  Poplit    4.0  2.8         Right Tibial Motor (Abd Hall Brev)  32C  Ankle    3.6 <6.0 18.8 >4 Knee Ankle 8.0 40.0 50 >40  Knee  11.6  12.3         Right Ulnar Motor (Abd Dig Minimi)  32C  Wrist    2.5 <3.1 11.2 >7 B Elbow Wrist 3.2 21.0 66 >50  B Elbow    5.7  10.0  A Elbow B Elbow 1.6 10.0 63 >50  A Elbow    7.3  10.0          Comparison Summary Table   Stim Site NR Peak (ms) Norm Peak (ms) P-T Amp (V) Site1 Site2 Delta-P (ms) Norm Delta (ms)  Right Median/Ulnar Palm Comparison (Wrist - 8cm)  32C  Median Palm    1.8 <2.2 78.4 Median Palm Ulnar Palm 0.0   Ulnar Palm    1.8 <2.2 22.7       H Reflex Studies   NR H-Lat (ms) Lat Norm (ms) L-R H-Lat (ms)  Right Tibial (Gastroc)  32C     30.61 <35    EMG  Side Muscle Ins Act Fibs Psw Fasc Number Recrt Dur Dur. Amp Amp. Poly Poly. Comment  Right AntTibialis Nml 1+ Nml Nml 2- Rapid Some 1+ Some 1+ Some 1+ N/A  Right Gastroc Nml Nml Nml Nml 1- Rapid Some 1+ Some 1+ Some 1+ N/A  Right Flex Dig Long  Nml Nml Nml Nml 1- Mod-R Some 1+ Some 1+ Few 1+ N/A  Right AdductorLong Nml Nml Nml Nml Nml E.R. Some 1+ Some 1- Some 1+ N/A  Right BicepsFemS Nml Nml Nml Nml Nml E.R. Some 1+ Some 1- Some 1+ E.R.  Right GluteusMed Nml Nml Nml Nml Nml Nml Nml Nml Nml Nml Nml Nml N/A  Right VastusLat Nml Nml Nml Nml NE None - - - - - - N/A  Right Iliacus Nml 1+ Nml Nml NE None - - - - - - N/A  Right Lumbo Parasp Low Nml Nml Nml Nml - - - - - - - - N/A  Right 1stDorInt Nml Nml Nml Nml Nml Nml Nml Nml Nml Nml Nml Nml N/A  Right FlexPolLong Nml Nml Nml Nml Nml E.R. All 1+ All 1- All 1+ E.R.  Right PronatorTeres Nml Nml Nml Nml Nml E.R. All 1+ All 1- All 1+ E.R.  Right Biceps Nml 2+ Nml Nml Nml E.R. All 1+ All 1- All 1+ E.R.  Right Deltoid Nml Nml Nml Nml Nml Nml Some 1+ Some 1- Some 1+ N/A  Right Infraspinatus Nml Nml Nml Nml Nml E.R. Many 1+ Many 1- Many 1+ E.R.  Right Triceps Nml 1+ Nml Nml Nml E.R. All 1+ All 1- All 1+ E.R.  Right Ext Indicis Nml Nml Nml Nml Nml E.R. Some 1+ Some 1- Some 1+ N/A      Waveforms:

## 2021-04-26 NOTE — Telephone Encounter (Signed)
Pt called and lm with AN. Said she was returning a call to [employer].

## 2021-04-26 NOTE — Telephone Encounter (Signed)
Looks like Pt had a EMG today. I do not see an note from [facility].  Dr.Jaffe please advise

## 2021-04-27 NOTE — Telephone Encounter (Signed)
I spoke with Patricia Gentry on the phone to discuss EMG results.  I would like to check labs- specifically serum sed rate and myositis panel.  I would also like to refer her to [facility] for muscle biopsy of the LEFTquadricep and LEFT bicep muscles.   Orders placed.

## 2021-06-06 ENCOUNTER — Other Ambulatory Visit: Payer: Self-pay

## 2021-06-06 ENCOUNTER — Other Ambulatory Visit (INDEPENDENT_AMBULATORY_CARE_PROVIDER_SITE_OTHER): Payer: BC Managed Care – PPO

## 2021-06-06 DIAGNOSIS — R531 Weakness: Secondary | ICD-10-CM

## 2021-06-06 DIAGNOSIS — R292 Abnormal reflex: Secondary | ICD-10-CM

## 2021-06-06 DIAGNOSIS — G729 Myopathy, unspecified: Secondary | ICD-10-CM | POA: Diagnosis not present

## 2021-06-16 ENCOUNTER — Other Ambulatory Visit: Payer: Self-pay | Admitting: General Surgery

## 2021-06-18 LAB — MYOMARKER 3 PLUS PROFILE (RDL)

## 2021-06-23 ENCOUNTER — Telehealth: Payer: Self-pay | Admitting: Neurology

## 2021-06-23 NOTE — Telephone Encounter (Signed)
When I called patient and informed her of her lab results patient stated that she has not got the biopsy done. Patient stated that when she went there the office was a "mess" and called her by a wrong name. Patient stated that she was informed by the surgeon that she would need to be put to sleep and that the surgery would take 1 1/2 hours. Patient stated this is not what she was told by Dr. Tomi Likens and not what she was expecting. So patient has not done the biopsy.

## 2021-06-23 NOTE — Telephone Encounter (Signed)
Pt called in stating she was supposed to go to [facility] and have a "pinch" biopsy done. She was told when they went there that the surgeon wants to take an "inch" out of her thigh and bicep. She would like some clarification on this.

## 2021-06-26 NOTE — Telephone Encounter (Signed)
I called and spoke to Patricia Gentry.  I answered all questions to the best of my ability.  She is agreeable to getting the biopsies.

## 2021-07-24 NOTE — Progress Notes (Signed)
Surgical Instructions    Your procedure is scheduled on 08/01/21.  Report to [facility] Main Entrance "A" at 1:30 P.M., then check in with the Admitting office.  Call this number if you have problems the morning of surgery:  [phone]   If you have any questions prior to your surgery date call [phone]: Open Monday-Friday 8am-4pm    Remember:  Do not eat after midnight the night before your surgery  You may drink clear liquids until 12:30PM the morning of your surgery.   Clear liquids allowed are: Water, Non-Citrus Juices (without pulp), Carbonated Beverages, Clear Tea, Black Coffee ONLY (NO MILK, CREAM OR POWDERED CREAMER of any kind), and Gatorade  Patient Instructions  The night before surgery:  No food after midnight. ONLY clear liquids after midnight  The day of surgery (if you do NOT have diabetes):  Drink ONE (1) Pre-Surgery Clear Ensure by 12:30PM the morning of surgery. Drink in one sitting. Do not sip.  This drink was given to you during your hospital  pre-op appointment visit. Nothing else to drink after completing the  Pre-Surgery Clear Ensure.         If you have questions, please contact your surgeon's office.     Take these medicines the morning of surgery with A SIP OF WATER  escitalopram (LEXAPRO)  estradiol (ESTRACE)   As of today, STOP taking any Aspirin (unless otherwise instructed by your surgeon) Aleve, Naproxen, Ibuprofen, Motrin, Advil, Goody's, BC's, all herbal medications, fish oil, and all vitamins.     After your COVID test   You are not required to quarantine however you are required to wear a well-fitting mask when you are out and around people not in your household.  If your mask becomes wet or soiled, replace with a new one.  Wash your hands often with soap and water for 20 seconds or clean your hands with an alcohol-based hand sanitizer that contains at least 60% alcohol.  Do not share personal items.  Notify your provider: if  you are in close contact with someone who has COVID  or if you develop a fever of 100.4 or greater, sneezing, cough, sore throat, shortness of breath or body aches.             Do not wear jewelry or makeup Do not wear lotions, powders, perfumes/colognes, or deodorant. Do not shave 48 hours prior to surgery.  Do not bring valuables to the hospital.  DO Not wear nail polish, gel polish, artificial nails, or any other type of covering on natural nails including finger and toenails. If patients have artificial nails, gel coating, etc. that need to be removed by a nail salon, please have this removed prior to surgery or surgery may need to be canceled/delayed if the surgeon/ anesthesia feels like the patient is unable to be adequately monitored.             [facility] is not responsible for any belongings or valuables.  Do NOT Smoke (Tobacco/Vaping)  24 hours prior to your procedure  If you use a CPAP at night, you may bring your mask for your overnight stay.   Contacts, glasses, hearing aids, dentures or partials may not be worn into surgery, please bring cases for these belongings   For patients admitted to the hospital, discharge time will be determined by your treatment team.   Patients discharged the day of surgery will not be allowed to drive home, and someone needs to stay with them  for 24 hours.  NO VISITORS WILL BE ALLOWED IN PRE-OP WHERE PATIENTS ARE PREPPED FOR SURGERY.  ONLY 1 SUPPORT PERSON MAY BE PRESENT IN THE WAITING ROOM WHILE YOU ARE IN SURGERY.  IF YOU ARE TO BE ADMITTED, ONCE YOU ARE IN YOUR ROOM YOU WILL BE ALLOWED TWO (2) VISITORS. 1 (ONE) VISITOR MAY STAY OVERNIGHT BUT MUST ARRIVE TO THE ROOM BY 8pm.  Minor children may have two parents present. Special consideration for safety and communication needs will be reviewed on a case by case basis.  Special instructions:    Oral Hygiene is also important to reduce your risk of infection.  Remember - BRUSH YOUR TEETH THE  MORNING OF SURGERY WITH YOUR REGULAR TOOTHPASTE   [facility] [facility]- Preparing For Surgery  Before surgery, you can play an important role. Because skin is not sterile, your skin needs to be as free of germs as possible. You can reduce the number of germs on your skin by washing with CHG (chlorahexidine gluconate) Soap before surgery.  CHG is an antiseptic cleaner which kills germs and bonds with the skin to continue killing germs even after washing.     Please do not use if you have an allergy to CHG or antibacterial soaps. If your skin becomes reddened/irritated stop using the CHG.  Do not shave (including legs and underarms) for at least 48 hours prior to first CHG shower. It is OK to shave your face.  Please follow these instructions carefully.     Shower the NIGHT BEFORE SURGERY and the MORNING OF SURGERY with CHG Soap.   If you chose to wash your hair, wash your hair first as usual with your normal shampoo. After you shampoo, rinse your hair and body thoroughly to remove the shampoo.  Then [employer] and genitals (private parts) with your normal soap and rinse thoroughly to remove soap.  After that Use CHG Soap as you would any other liquid soap. You can apply CHG directly to the skin and wash gently with a scrungie or a clean washcloth.   Apply the CHG Soap to your body ONLY FROM THE NECK DOWN.  Do not use on open wounds or open sores. Avoid contact with your eyes, ears, mouth and genitals (private parts). Wash Face and genitals (private parts)  with your normal soap.   Wash thoroughly, paying special attention to the area where your surgery will be performed.  Thoroughly rinse your body with warm water from the neck down.  DO NOT shower/wash with your normal soap after using and rinsing off the CHG Soap.  Pat yourself dry with a CLEAN TOWEL.  Wear CLEAN PAJAMAS to bed the night before surgery  Place CLEAN SHEETS on your bed the night before your surgery  DO NOT SLEEP WITH  PETS.   Day of Surgery: Take a shower with CHG soap. Wear Clean/Comfortable clothing the morning of surgery Do not apply any deodorants/lotions.   Remember to brush your teeth WITH YOUR REGULAR TOOTHPASTE.   Please read over the following fact sheets that you were given.

## 2021-07-25 ENCOUNTER — Encounter (HOSPITAL_COMMUNITY): Payer: Self-pay

## 2021-07-25 ENCOUNTER — Encounter (HOSPITAL_COMMUNITY)
Admission: RE | Admit: 2021-07-25 | Discharge: 2021-07-25 | Disposition: A | Discharge status: Home / Self Care | Payer: BC Managed Care – PPO | Source: Ambulatory Visit | Attending: General Surgery | Admitting: General Surgery

## 2021-07-25 ENCOUNTER — Other Ambulatory Visit: Payer: Self-pay

## 2021-07-25 DIAGNOSIS — Z01812 Encounter for preprocedural laboratory examination: Secondary | ICD-10-CM | POA: Insufficient documentation

## 2021-07-25 HISTORY — DX: Malignant (primary) neoplasm, unspecified: C80.1

## 2021-07-25 HISTORY — DX: Unspecified osteoarthritis, unspecified site: M19.90

## 2021-07-25 LAB — CBC
HCT: 43.3 % (ref 36.0–46.0)
Hemoglobin: 14.3 g/dL (ref 12.0–15.0)
MCH: 30.8 pg (ref 26.0–34.0)
MCHC: 33 g/dL (ref 30.0–36.0)
MCV: 93.3 fL (ref 80.0–100.0)
Platelets: 243 10*3/uL (ref 150–400)
RBC: 4.64 MIL/uL (ref 3.87–5.11)
RDW: 12.5 % (ref 11.5–15.5)
WBC: 5.7 10*3/uL (ref 4.0–10.5)
nRBC: 0 % (ref 0.0–0.2)

## 2021-07-25 NOTE — Progress Notes (Signed)
PCP - Dian Situ, MD Cardiologist - denies  PPM/ICD - denies Device Orders - n/a Rep Notified - n/a  Chest x-ray - n/a EKG - n/a Stress Test - denies ECHO - denies Cardiac Cath - denies  Sleep Study - denies CPAP - n/a  Fasting Blood Sugar - n/a  Blood Thinner Instructions: n/a  Aspirin Instructions: Patient was instructed: As of today, STOP taking any Aspirin (unless otherwise instructed by your surgeon) Aleve, Naproxen, Ibuprofen, Motrin, Advil, Goody's, BC's, all herbal medications, fish oil, and all vitamins  ERAS Protcol - yes PRE-SURGERY Ensure - yes  COVID TEST- no - ambulatory surgery  Anesthesia review: no  Patient denies shortness of breath, fever, cough and chest pain at PAT appointment   All instructions explained to the patient, with a verbal understanding of the material. Patient agrees to go over the instructions while at home for a better understanding. Patient also instructed to self quarantine after being tested for COVID-19. The opportunity to ask questions was provided.

## 2021-08-01 ENCOUNTER — Telehealth: Payer: Self-pay | Admitting: Neurology

## 2021-08-01 NOTE — Telephone Encounter (Signed)
Patient called and said [facility] called her at the last minute last night and rescheduled her muscle biopsy from today to 08/22/21.  She will have to have labs redone before the surgery.   Patient expressed frustration about having to reschedule the biopsy at the last minute.  She'd like Dr. Tomi Likens to be aware.

## 2021-08-22 ENCOUNTER — Other Ambulatory Visit: Payer: Self-pay | Admitting: Family Medicine

## 2021-08-22 ENCOUNTER — Ambulatory Visit (HOSPITAL_COMMUNITY): Admission: RE | Admit: 2021-08-22 | Payer: BC Managed Care – PPO | Source: Home / Self Care | Admitting: General Surgery

## 2021-08-22 ENCOUNTER — Encounter (HOSPITAL_COMMUNITY): Admission: RE | Payer: Self-pay | Source: Home / Self Care

## 2021-08-22 DIAGNOSIS — Z1231 Encounter for screening mammogram for malignant neoplasm of breast: Secondary | ICD-10-CM

## 2021-08-22 SURGERY — MUSCLE BIOPSY
Anesthesia: General | Laterality: Left

## 2021-08-28 ENCOUNTER — Other Ambulatory Visit: Payer: Self-pay

## 2021-08-28 ENCOUNTER — Ambulatory Visit
Admission: RE | Admit: 2021-08-28 | Discharge: 2021-08-28 | Disposition: A | Discharge status: Home / Self Care | Payer: BC Managed Care – PPO | Source: Ambulatory Visit | Attending: Family Medicine | Admitting: Family Medicine

## 2021-08-28 DIAGNOSIS — Z1231 Encounter for screening mammogram for malignant neoplasm of breast: Secondary | ICD-10-CM

## 2021-09-18 ENCOUNTER — Ambulatory Visit: Payer: BC Managed Care – PPO | Admitting: Neurology

## 2021-10-13 ENCOUNTER — Encounter (HOSPITAL_BASED_OUTPATIENT_CLINIC_OR_DEPARTMENT_OTHER): Payer: Self-pay | Admitting: General Surgery

## 2021-10-20 ENCOUNTER — Other Ambulatory Visit: Payer: Self-pay | Admitting: General Surgery

## 2021-10-23 ENCOUNTER — Encounter (HOSPITAL_COMMUNITY): Payer: Self-pay | Admitting: Anesthesiology

## 2021-10-23 NOTE — Anesthesia Preprocedure Evaluation (Deleted)
Anesthesia Evaluation    Reviewed: Allergy & Precautions, Patient's Chart, lab work & pertinent test results  History of Anesthesia Complications (+) PONV and history of anesthetic complications  Airway        Dental   Pulmonary neg pulmonary ROS,           Cardiovascular negative cardio ROS       Neuro/Psych Anxiety Depression negative neurological ROS     GI/Hepatic Neg liver ROS, GERD  Controlled,  Endo/Other  negative endocrine ROS  Renal/GU negative Renal ROS  negative genitourinary   Musculoskeletal  (+) Arthritis , Generalized muscle weakness   Abdominal   Peds  Hematology negative hematology ROS (+)   Anesthesia Other Findings Day of surgery medications reviewed with patient.  Reproductive/Obstetrics negative OB ROS                             Anesthesia Physical Anesthesia Plan  ASA: 2  Anesthesia Plan: General   Post-op Pain Management: Tylenol PO (pre-op)   Induction: Intravenous  PONV Risk Score and Plan: 4 or greater and Midazolam, Ondansetron, Dexamethasone and Treatment may vary due to age or medical condition  Airway Management Planned: LMA  Additional Equipment: None  Intra-op Plan:   Post-operative Plan: Extubation in OR  Informed Consent:   Plan Discussed with:   Anesthesia Plan Comments:         Anesthesia Quick Evaluation

## 2021-10-25 ENCOUNTER — Ambulatory Visit (HOSPITAL_BASED_OUTPATIENT_CLINIC_OR_DEPARTMENT_OTHER): Admission: RE | Admit: 2021-10-25 | Payer: BC Managed Care – PPO | Source: Home / Self Care | Admitting: General Surgery

## 2021-10-25 SURGERY — MUSCLE BIOPSY
Anesthesia: General | Laterality: Left

## 2021-10-25 MED ORDER — EPHEDRINE 5 MG/ML INJ
INTRAVENOUS | Status: AC
  Filled 2021-10-25: qty 5

## 2021-10-25 MED ORDER — SUCCINYLCHOLINE CHLORIDE 200 MG/10ML IV SOSY
PREFILLED_SYRINGE | INTRAVENOUS | Status: AC
  Filled 2021-10-25: qty 10

## 2021-10-25 MED ORDER — ONDANSETRON HCL 4 MG/2ML IJ SOLN
INTRAMUSCULAR | Status: AC
  Filled 2021-10-25: qty 2

## 2021-10-25 MED ORDER — FENTANYL CITRATE (PF) 100 MCG/2ML IJ SOLN
INTRAMUSCULAR | Status: AC
  Filled 2021-10-25: qty 2

## 2021-10-25 MED ORDER — PROPOFOL 500 MG/50ML IV EMUL
INTRAVENOUS | Status: AC
  Filled 2021-10-25: qty 50

## 2021-10-25 MED ORDER — MIDAZOLAM HCL 2 MG/2ML IJ SOLN
INTRAMUSCULAR | Status: AC
  Filled 2021-10-25: qty 2

## 2021-10-25 MED ORDER — BUPIVACAINE HCL (PF) 0.25 % IJ SOLN
INTRAMUSCULAR | Status: AC
  Filled 2021-10-25: qty 30

## 2021-10-25 MED ORDER — LIDOCAINE 2% (20 MG/ML) 5 ML SYRINGE
INTRAMUSCULAR | Status: AC
  Filled 2021-10-25: qty 5

## 2021-10-25 MED ORDER — DEXAMETHASONE SODIUM PHOSPHATE 10 MG/ML IJ SOLN
INTRAMUSCULAR | Status: AC
  Filled 2021-10-25: qty 1

## 2021-10-25 MED ORDER — PHENYLEPHRINE 40 MCG/ML (10ML) SYRINGE FOR IV PUSH (FOR BLOOD PRESSURE SUPPORT)
PREFILLED_SYRINGE | INTRAVENOUS | Status: AC
  Filled 2021-10-25: qty 10

## 2021-10-25 MED ORDER — LIDOCAINE-EPINEPHRINE (PF) 1 %-1:200000 IJ SOLN
INTRAMUSCULAR | Status: AC
  Filled 2021-10-25: qty 30

## 2021-10-25 MED ORDER — ATROPINE SULFATE 0.4 MG/ML IV SOLN
INTRAVENOUS | Status: AC
  Filled 2021-10-25: qty 1

## 2021-10-31 ENCOUNTER — Inpatient Hospital Stay: Payer: BC Managed Care – PPO | Attending: Gynecologic Oncology | Admitting: Gynecologic Oncology

## 2021-10-31 ENCOUNTER — Other Ambulatory Visit: Payer: Self-pay

## 2021-10-31 ENCOUNTER — Other Ambulatory Visit (HOSPITAL_COMMUNITY)
Admission: RE | Admit: 2021-10-31 | Discharge: 2021-10-31 | Disposition: A | Discharge status: Home / Self Care | Payer: BC Managed Care – PPO | Source: Ambulatory Visit | Attending: Gynecologic Oncology | Admitting: Gynecologic Oncology

## 2021-10-31 VITALS — BP 150/92 | HR 82 | Temp 98.0°F | Resp 18 | Ht 63.78 in | Wt 142.6 lb

## 2021-10-31 DIAGNOSIS — F419 Anxiety disorder, unspecified: Secondary | ICD-10-CM | POA: Insufficient documentation

## 2021-10-31 DIAGNOSIS — K219 Gastro-esophageal reflux disease without esophagitis: Secondary | ICD-10-CM | POA: Insufficient documentation

## 2021-10-31 DIAGNOSIS — Z90710 Acquired absence of both cervix and uterus: Secondary | ICD-10-CM | POA: Insufficient documentation

## 2021-10-31 DIAGNOSIS — F32A Depression, unspecified: Secondary | ICD-10-CM | POA: Diagnosis not present

## 2021-10-31 DIAGNOSIS — Z9079 Acquired absence of other genital organ(s): Secondary | ICD-10-CM | POA: Insufficient documentation

## 2021-10-31 DIAGNOSIS — C52 Malignant neoplasm of vagina: Secondary | ICD-10-CM | POA: Diagnosis not present

## 2021-10-31 DIAGNOSIS — R87810 Cervical high risk human papillomavirus (HPV) DNA test positive: Secondary | ICD-10-CM | POA: Diagnosis not present

## 2021-10-31 DIAGNOSIS — Z79899 Other long term (current) drug therapy: Secondary | ICD-10-CM | POA: Diagnosis not present

## 2021-10-31 NOTE — Progress Notes (Signed)
Gynecologic Oncology Follow Up   Consult was initially requested by Dr. Sabra Heck for the evaluation of Patricia Gentry 66 y.o. female  CC:  Follow up for Vaginal Cancer  Assessment/Plan: Patricia Gentry  is a 66 y.o. 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 No evidence for recurrence on today's examination. A pap smear with HPV testing was obtained today and the patient will be contacted with the results.    Next pap smear due at that visit in January, 2024.  If she has no evidence of recurrence at that visit, she can be discharged to Dr Sabra Heck for ongoing routine gynecologic care or alternate visits with our office if preferred.  HPI: Patricia Gentry is a very pleasant 66 year old G0 who was seen in consultation at the request of Dr Sabra Heck for Patricia Gentry.    Per Hx from Dr. Denman George: The patient reports that she has a history of vaginal and cervical dysplasia for "all my life". She underwent an abdominal hysterectomy in 1984 for endometriosis. Approximate 4 years following this, she began developing vaginal dysplasia. This predominantly low-grade and required minimal intervention. Her last interventional procedure was a vaginal laser for high-grade lesion in 2005. Following that, she had a series of ASCUS pap smears with negative high-risk HPV. However in 09/02/2015, she had an ASCUS pap with high-risk HPV identified. This prompted a colposcopic evaluation on 09/15/2015 and biopsies of the anterior vagina. Dr. Sabra Heck notes that she saw a lesion on the anterior wall of the vaginal cuff. 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 On January 5th, 2017 she underwent a CO2 laser of the anterior vaginal cuff. The procedure was uncomplicated.  Pap in July 2017 showed ASCUS. Colpo and directed  biopsies of a slightly erythematous area at the cuff showed "fragments of reactive inflamed squamous epithelium, negative for dysplasia or malignancy" on 09/20/16.  On 11/02/16, she underwent colposcopic directed biopsies of the anterior and posterior vaginal cuff. The anterior cuff biopsies confirmed focal invasive SCC. There was no grossly visible lesion on inspection and no mass to palpate.  PET/CT on 11/19/16 showed no vaginal or pelvic mass and no distant metastatic disease (an area seen in the left breast corresponded to a traumatic injury).  On 12/04/16, she underwent robotic assisted upper vaginectomy. The final pathology revealed a 1 mm residual focus of squamous cell carcinoma (2.14mm on original biopsy). The margins were widely negative peripherally with a 3 mm deep margin (this represents where the bladder was reflected from the upper vagina). There was a small focus of LVSI on the specimen.  10 weeks post op she had intercourse which was painful. She felt a "pop" and had vaginal spotting for 2 days afterwards. Granulation tissue was seen (and biopsy confirmed) on exam.  Pap in December, 2018 was benign and HPV negative. She developed dysphagia (esophageal) - negative EGD, refractory to "stretching". Pap in November, 2019 was benign with HPV negative. Pap in January, 2021 was benign with HPV negative. Last pap smear in January 2022 was negative with HPV high risk negative.  Interval Hx: She presents today for follow up. She voices no symptoms concerning for recurrence of vaginal cancer. 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 She is scheduled for a  muscle biopsy in Feb with Dr. Barry Dienes. She has been falling due to this and wears bilateral braces to support her knees. No change in weight or appetite. No abdominal pain, vaginal bleeding, or discharge reported.    Mammogram: 08/29/2021  Current Meds:  Outpatient Encounter Medications as of 10/31/2021   Medication Sig   Cholecalciferol (VITAMIN D3) 2000 units TABS Take 2,000 Units by mouth daily.   escitalopram (LEXAPRO) 10 MG tablet TAKE ONE TABLET BY MOUTH DAILY   estradiol (ESTRACE) 0.5 MG tablet TAKE 1 TABLET BY MOUTH ONCE DAILY   fluticasone (FLONASE) 50 MCG/ACT nasal spray Place 2 sprays into both nostrils daily as needed for allergies.   Multiple Vitamin (MULTIVITAMIN) capsule Take 1 capsule by mouth daily.   rosuvastatin (CRESTOR) 20 MG tablet Take 20 mg by mouth at bedtime.   senna (SENOKOT) 8.6 MG TABS tablet Take 1 tablet (8.6 mg total) by mouth at bedtime.   No facility-administered encounter medications on file as of 10/31/2021.    Allergy:  No Known Allergies  Social Hx:   Social History   Socioeconomic History   Marital status: Divorced    Spouse name: Not on file   Number of children: Not on file   Years of education: Not on file   Highest education level: Not on file  Occupational History   Not on file  Tobacco Use   Smoking status: Never   Smokeless tobacco: Never  Vaping Use   Vaping Use: Never used  Substance and Sexual Activity   Alcohol use: No    Alcohol/week: 0.0 standard drinks   Drug use: No   Sexual activity: Yes    Partners: Male    Birth control/protection: Surgical    Comment: LAVH/BSO  Other Topics Concern   Not on file  Social History Narrative   Right handed   Social Determinants of Health   Financial Resource Strain: Not on file  Food Insecurity: Not on file  Transportation Needs: Not on file  Physical Activity: Not on file  Stress: Not on file  Social Connections: Not on file  Intimate Partner Violence: Not on file    Past Surgical Hx:  Past Surgical History:  Procedure Laterality Date   ABDOMINAL HYSTERECTOMY     BREAST BIOPSY     needle bx only   CERVICAL CONE BIOPSY  mid  6767'M   CO2 LASER APPLICATION N/A 09/47/0962   Procedure: CO2 LASER APPLICATION OF THE VAGINA;  Surgeon: Everitt Amber, MD;  Location: [place];  Service: Gynecology;  Laterality: N/A;   EYE SURGERY     left eye cataract   LAPAROSCOPIC ASSISTED VAGINAL HYSTERECTOMY  1984   and BSO   LASER ABLATION VAGINAL DYSPLASIA  2005   high-grade    ROBOTIC ASSISTED TOTAL HYSTERECTOMY N/A 12/04/2016   Procedure: ROBOTIC ASSISTED UPPER VAGINECTOMY;  Surgeon: Everitt Amber, MD;  Location: [facility];  Service: Gynecology;  Laterality: N/A;   UPPER GI ENDOSCOPY  04/2018    Past Medical Hx:  Past Medical History:  Diagnosis Date   Anxiety    Arthritis    Cancer ([facility])    Complication of anesthesia    slow to wake   Depression    GERD (gastroesophageal reflux disease)    High risk HPV infection    per Pap 09-02-2015   History of cervical dysplasia    CONIZATION 1980'S   History of condyloma acuminatum    vulva--  2009   History of vaginal  dysplasia    VAIN 2--  LASER ABLATION 2005   PONV (postoperative nausea and vomiting)    VAIN III (vaginal intraepithelial neoplasia grade III)    Wears glasses     Past Gynecological History:  G0, hysterectomy for endometriosis in 1984  Patient's last menstrual period was 10/15/1992.  Family Hx:  Family History  Problem Relation Age of Onset   Hypertension Mother    Heart disease Mother    Hypertension Father    Heart disease Father    Hypertension Brother    Heart disease Brother    Stroke Brother    Diabetes Brother        younger brother   Osteoporosis Maternal Aunt     Review of Systems: Constitutional: Feels well but having challenges with whole body neuropathy symptoms. No change in appetite or unintentional weight loss or gain.    ENT: Normal appearing ears and nares bilaterally Skin/Breast: No new rash, sores, jaundice, itching, dryness Cardiovascular: No chest pain, shortness of breath, or edema  Pulmonary: Has cough- using flonase- follows with PCP for this.  Gastro Intestinal: No nausea, vomiting, or diarrhea. No bright red blood per rectum, no abdominal pain,  change in bowel movement, or constipation.  Genito Urinary: No frequency, urgency, dysuria, see HPI Musculo Skeletal: Positive for new whole body neuropathy symptoms. + for falls. Uses braces for knee support. Going through workup for this.   Vitals:  Blood pressure (!) 150/92, pulse 82, temperature 98 F (36.7 C), temperature source Oral, resp. rate 18, height 5' 3.78" (1.62 m), weight 142 lb 9.6 oz (64.7 kg), last menstrual period 10/15/1992, SpO2 97 %.  Physical Exam: General: Well developed, well nourished female in no acute distress. Alert and oriented x 3.  Neck: Supple without any enlargements.  Lymph node survey: No cervical, supraclavicular, or inguinal adenopathy.  Cardiovascular: Regular rate and rhythm. S1 and S2 normal.  Lungs: Clear to auscultation bilaterally. No wheezes/crackles/rhonchi noted.  Skin: No rashes or lesions present. Back: No CVA tenderness.  Abdomen: Abdomen soft, non-tender and non-obese. Active bowel sounds in all quadrants. No evidence of a fluid wave or abdominal masses.  Genitourinary:    Vulva/vagina: Normal external female genitalia. No lesions.    Urethra: No lesions or masses    Vagina: Atrophic without any lesions. No palpable masses. No vaginal bleeding or drainage noted. Thin prep pap smear obtained without difficulty. Rectal: Good tone, no masses, no cul de sac nodularity.  Extremities: No bilateral cyanosis, edema, or clubbing.    Dorothyann Gibbs, NP  10/31/2021, 10:55 AM

## 2021-10-31 NOTE — Patient Instructions (Addendum)
Your exam today was normal. We will contact you with the results of your pap smear from today. If normal, you will graduate from cancer surveillance visits at the cancer center and can follow-up with Dr Sabra Heck for routine gynecologic care. If you would like, you can always alternate with our office as well.   Please call the office at [phone] for any questions, concerns, or new symptoms including vaginal bleeding, new pelvic or abdominal pains, bloating, feeling full easy, or a change in bladder or bowel function.

## 2021-11-06 ENCOUNTER — Telehealth: Payer: Self-pay

## 2021-11-06 LAB — CYTOLOGY - PAP
Comment: NEGATIVE
Diagnosis: NEGATIVE
High risk HPV: NEGATIVE

## 2021-11-06 NOTE — Telephone Encounter (Signed)
Spoke with Patricia Gentry and let her know that her pap was normal and HPV high risk is negative.  She was super happy!

## 2021-11-09 ENCOUNTER — Other Ambulatory Visit: Payer: Self-pay | Admitting: General Surgery

## 2021-11-09 NOTE — Progress Notes (Signed)
Surgical Instructions    Your procedure is scheduled on Thursday, November 16, 2021 at 11:00 AM.  Report to [facility] Main Entrance "A" at 9:00 A.M., then check in with the Admitting office.  Call this number if you have problems the morning of surgery:  [phone]   If you have any questions prior to your surgery date call [phone]: Open Monday-Friday 8am-4pm    Remember:  Do not eat after midnight the night before your surgery  You may drink clear liquids until 8:00 AM the morning of your surgery.   Clear liquids allowed are: Water, Non-Citrus Juices (without pulp), Carbonated Beverages, Clear Tea, Black Coffee Only (NO MILK, CREAM OR POWDERED CREAMER of any kind), and Gatorade.    Take these medicines the morning of surgery with A SIP OF WATER:  amLODipine (NORVASC) amoxicillin (AMOXIL) escitalopram (LEXAPRO) estradiol (ESTRACE) fluticasone (FLONASE) - if needed   As of today, STOP taking any Aspirin (unless otherwise instructed by your surgeon) Aleve, Naproxen, Ibuprofen, Motrin, Advil, Goody's, BC's, all herbal medications, fish oil, and all vitamins.                     Do NOT Smoke (Tobacco/Vaping) for 24 hours prior to your procedure.  If you use a CPAP at night, you may bring your mask/headgear for your overnight stay.   Contacts, glasses, piercing's, hearing aid's, dentures or partials may not be worn into surgery, please bring cases for these belongings.    For patients admitted to the hospital, discharge time will be determined by your treatment team.   Patients discharged the day of surgery will not be allowed to drive home, and someone needs to stay with them for 24 hours.  NO VISITORS WILL BE ALLOWED IN PRE-OP WHERE PATIENTS ARE PREPPED FOR SURGERY.  ONLY 1 SUPPORT PERSON MAY BE PRESENT IN THE WAITING ROOM WHILE YOU ARE IN SURGERY.  IF YOU ARE TO BE ADMITTED, ONCE YOU ARE IN YOUR ROOM YOU WILL BE ALLOWED TWO (2) VISITORS. (1) VISITOR MAY STAY OVERNIGHT BUT  MUST ARRIVE TO THE ROOM BY 8pm.  Minor children may have two parents present. Special consideration for safety and communication needs will be reviewed on a case by case basis.   Special instructions:   [place]- Preparing For Surgery  Before surgery, you can play an important role. Because skin is not sterile, your skin needs to be as free of germs as possible. You can reduce the number of germs on your skin by washing with CHG (chlorahexidine gluconate) Soap before surgery.  CHG is an antiseptic cleaner which kills germs and bonds with the skin to continue killing germs even after washing.    Oral Hygiene is also important to reduce your risk of infection.  Remember - BRUSH YOUR TEETH THE MORNING OF SURGERY WITH YOUR REGULAR TOOTHPASTE  Please do not use if you have an allergy to CHG or antibacterial soaps. If your skin becomes reddened/irritated stop using the CHG.  Do not shave (including legs and underarms) for at least 48 hours prior to first CHG shower. It is OK to shave your face.  Please follow these instructions carefully.   Shower the NIGHT BEFORE SURGERY and the MORNING OF SURGERY  If you chose to wash your hair, wash your hair first as usual with your normal shampoo.  After you shampoo, rinse your hair and body thoroughly to remove the shampoo.  Use CHG Soap as you would any other liquid soap. You  can apply CHG directly to the skin and wash gently with a scrungie or a clean washcloth.   Apply the CHG Soap to your body ONLY FROM THE NECK DOWN.  Do not use on open wounds or open sores. Avoid contact with your eyes, ears, mouth and genitals (private parts). Wash Face and genitals (private parts)  with your normal soap.   Wash thoroughly, paying special attention to the area where your surgery will be performed.  Thoroughly rinse your body with warm water from the neck down.  DO NOT shower/wash with your normal soap after using and rinsing off the CHG Soap.  Pat yourself  dry with a CLEAN TOWEL.  Wear CLEAN PAJAMAS to bed the night before surgery  Place CLEAN SHEETS on your bed the night before your surgery  DO NOT SLEEP WITH PETS.   Day of Surgery: Shower with CHG soap. Do not wear jewelry, make up, nail polish, gel polish, artificial nails, or any other type of covering on natural nails including finger and toenails. If patients have artificial nails, gel coating, etc. that need to be removed by a nail salon please have this removed prior to surgery. Surgery may need to be canceled/delayed if the surgeon/anesthesiologist feels like the patient is unable to be adequately monitored. Do not wear lotions, powders, perfumes, or deodorant. Do not shave 48 hours prior to surgery. Do not bring valuables to the hospital. [facility] is not responsible for any belongings or valuables. Wear Clean/Comfortable clothing the morning of surgery Remember to brush your teeth WITH YOUR REGULAR TOOTHPASTE.   Please read over the following fact sheets that you were given.

## 2021-11-10 ENCOUNTER — Other Ambulatory Visit: Payer: Self-pay

## 2021-11-10 ENCOUNTER — Encounter (HOSPITAL_COMMUNITY): Payer: Self-pay

## 2021-11-10 ENCOUNTER — Encounter (HOSPITAL_COMMUNITY)
Admission: RE | Admit: 2021-11-10 | Discharge: 2021-11-10 | Disposition: A | Discharge status: Home / Self Care | Payer: BC Managed Care – PPO | Source: Ambulatory Visit | Attending: General Surgery | Admitting: General Surgery

## 2021-11-10 VITALS — BP 126/76 | HR 73 | Temp 97.9°F | Resp 17 | Ht 64.0 in | Wt 142.0 lb

## 2021-11-10 DIAGNOSIS — I2510 Atherosclerotic heart disease of native coronary artery without angina pectoris: Secondary | ICD-10-CM | POA: Insufficient documentation

## 2021-11-10 DIAGNOSIS — Z01818 Encounter for other preprocedural examination: Secondary | ICD-10-CM | POA: Insufficient documentation

## 2021-11-10 HISTORY — DX: Essential (primary) hypertension: I10

## 2021-11-10 LAB — BASIC METABOLIC PANEL
Anion gap: 8 (ref 5–15)
BUN: 15 mg/dL (ref 8–23)
CO2: 29 mmol/L (ref 22–32)
Calcium: 9.4 mg/dL (ref 8.9–10.3)
Chloride: 104 mmol/L (ref 98–111)
Creatinine, Ser: 0.55 mg/dL (ref 0.44–1.00)
GFR, Estimated: >60 mL/min (ref >60)
Glucose, Bld: 99 mg/dL (ref 70–99)
Potassium: 3.9 mmol/L (ref 3.5–5.1)
Sodium: 141 mmol/L (ref 135–145)

## 2021-11-10 NOTE — Progress Notes (Signed)
PCP - Dr. Dian Situ Cardiologist - Denies Neurologist: Dr. Metta Clines  PPM/ICD - Denies  Chest x-ray - N/A EKG - 11/10/21 Stress Test - Denies ECHO - Denies Cardiac Cath - Denies  Sleep Study - Denies  Patient is not diabetic.  Blood Thinner Instructions: N/A Aspirin Instructions: N/A  ERAS Protcol - Yes PRE-SURGERY Ensure or G2- No  COVID TEST- N/A   Anesthesia review: No  Patient denies shortness of breath, fever, cough and chest pain at PAT appointment   All instructions explained to the patient, with a verbal understanding of the material. Patient agrees to go over the instructions while at home for a better understanding. Patient also instructed to self quarantine after being tested for COVID-19. The opportunity to ask questions was provided.

## 2021-11-16 ENCOUNTER — Ambulatory Visit (HOSPITAL_COMMUNITY): Payer: BC Managed Care – PPO | Admitting: Vascular Surgery

## 2021-11-16 ENCOUNTER — Ambulatory Visit (HOSPITAL_COMMUNITY): Payer: BC Managed Care – PPO | Admitting: Anesthesiology

## 2021-11-16 ENCOUNTER — Encounter (HOSPITAL_COMMUNITY): Payer: Self-pay | Admitting: General Surgery

## 2021-11-16 ENCOUNTER — Other Ambulatory Visit: Payer: Self-pay

## 2021-11-16 ENCOUNTER — Ambulatory Visit (HOSPITAL_COMMUNITY)
Admission: RE | Admit: 2021-11-16 | Discharge: 2021-11-16 | Disposition: A | Discharge status: Home / Self Care | Payer: BC Managed Care – PPO | Attending: General Surgery | Admitting: General Surgery

## 2021-11-16 ENCOUNTER — Encounter (HOSPITAL_COMMUNITY)
Admission: RE | Disposition: A | Discharge status: Home / Self Care | Payer: Self-pay | Source: Home / Self Care | Attending: General Surgery

## 2021-11-16 DIAGNOSIS — K219 Gastro-esophageal reflux disease without esophagitis: Secondary | ICD-10-CM | POA: Insufficient documentation

## 2021-11-16 DIAGNOSIS — F32A Depression, unspecified: Secondary | ICD-10-CM | POA: Diagnosis not present

## 2021-11-16 DIAGNOSIS — Z9181 History of falling: Secondary | ICD-10-CM | POA: Insufficient documentation

## 2021-11-16 DIAGNOSIS — R531 Weakness: Secondary | ICD-10-CM | POA: Diagnosis present

## 2021-11-16 DIAGNOSIS — G7249 Other inflammatory and immune myopathies, not elsewhere classified: Secondary | ICD-10-CM | POA: Diagnosis not present

## 2021-11-16 DIAGNOSIS — Z8619 Personal history of other infectious and parasitic diseases: Secondary | ICD-10-CM | POA: Diagnosis not present

## 2021-11-16 DIAGNOSIS — I10 Essential (primary) hypertension: Secondary | ICD-10-CM | POA: Diagnosis not present

## 2021-11-16 DIAGNOSIS — F419 Anxiety disorder, unspecified: Secondary | ICD-10-CM | POA: Diagnosis not present

## 2021-11-16 HISTORY — PX: MUSCLE BIOPSY: SHX716

## 2021-11-16 SURGERY — MUSCLE BIOPSY
Anesthesia: General | Laterality: Left

## 2021-11-16 MED ORDER — PHENYLEPHRINE 40 MCG/ML (10ML) SYRINGE FOR IV PUSH (FOR BLOOD PRESSURE SUPPORT)
PREFILLED_SYRINGE | INTRAVENOUS | Status: AC
  Filled 2021-11-16: qty 10

## 2021-11-16 MED ORDER — ONDANSETRON HCL 4 MG/2ML IJ SOLN
INTRAMUSCULAR | Status: DC | PRN
  Administered 2021-11-16: 4 mg via INTRAVENOUS

## 2021-11-16 MED ORDER — CHLORHEXIDINE GLUCONATE 0.12 % MT SOLN
OROMUCOSAL | Status: AC
  Administered 2021-11-16: 15 mL via OROMUCOSAL
  Filled 2021-11-16: qty 15

## 2021-11-16 MED ORDER — ONDANSETRON HCL 4 MG/2ML IJ SOLN
INTRAMUSCULAR | Status: AC
  Filled 2021-11-16: qty 2

## 2021-11-16 MED ORDER — ACETAMINOPHEN 500 MG PO TABS: 1000.0000 mg | ORAL_TABLET | ORAL | Status: AC

## 2021-11-16 MED ORDER — BUPIVACAINE-EPINEPHRINE 0.25% -1:200000 IJ SOLN
INTRAMUSCULAR | Status: DC | PRN
Start: 2021-11-16 — End: 2021-11-16
  Administered 2021-11-16: 30 mL

## 2021-11-16 MED ORDER — DEXAMETHASONE SODIUM PHOSPHATE 10 MG/ML IJ SOLN
INTRAMUSCULAR | Status: DC | PRN
  Administered 2021-11-16: 10 mg via INTRAVENOUS

## 2021-11-16 MED ORDER — MIDAZOLAM HCL 2 MG/2ML IJ SOLN
INTRAMUSCULAR | Status: AC
  Filled 2021-11-16: qty 2

## 2021-11-16 MED ORDER — PROPOFOL 10 MG/ML IV BOLUS
INTRAVENOUS | Status: DC | PRN
  Administered 2021-11-16: 50 mg via INTRAVENOUS
  Administered 2021-11-16: 150 mg via INTRAVENOUS

## 2021-11-16 MED ORDER — PHENYLEPHRINE 40 MCG/ML (10ML) SYRINGE FOR IV PUSH (FOR BLOOD PRESSURE SUPPORT)
PREFILLED_SYRINGE | INTRAVENOUS | Status: DC | PRN
  Administered 2021-11-16: 80 ug via INTRAVENOUS

## 2021-11-16 MED ORDER — CEFAZOLIN SODIUM-DEXTROSE 2-4 GM/100ML-% IV SOLN
INTRAVENOUS | Status: AC
  Filled 2021-11-16: qty 100

## 2021-11-16 MED ORDER — EPHEDRINE SULFATE-NACL 50-0.9 MG/10ML-% IV SOSY
PREFILLED_SYRINGE | INTRAVENOUS | Status: DC | PRN
  Administered 2021-11-16: 5 mg via INTRAVENOUS

## 2021-11-16 MED ORDER — ORAL CARE MOUTH RINSE: 15.0000 mL | Freq: Once | OROMUCOSAL | Status: AC

## 2021-11-16 MED ORDER — FENTANYL CITRATE (PF) 250 MCG/5ML IJ SOLN
INTRAMUSCULAR | Status: DC | PRN
  Administered 2021-11-16 (×2): 50 ug via INTRAVENOUS

## 2021-11-16 MED ORDER — SCOPOLAMINE 1 MG/3DAYS TD PT72
MEDICATED_PATCH | TRANSDERMAL | Status: AC
  Administered 2021-11-16: 1.5 mg via TRANSDERMAL
  Filled 2021-11-16: qty 1

## 2021-11-16 MED ORDER — LACTATED RINGERS IV SOLN: INTRAVENOUS | Status: DC

## 2021-11-16 MED ORDER — EPHEDRINE 5 MG/ML INJ
INTRAVENOUS | Status: AC
  Filled 2021-11-16: qty 5

## 2021-11-16 MED ORDER — PHENYLEPHRINE HCL-NACL 20-0.9 MG/250ML-% IV SOLN
INTRAVENOUS | Status: DC | PRN
  Administered 2021-11-16: 10 ug/min via INTRAVENOUS

## 2021-11-16 MED ORDER — ACETAMINOPHEN 500 MG PO TABS
ORAL_TABLET | ORAL | Status: AC
  Administered 2021-11-16: 1000 mg via ORAL
  Filled 2021-11-16: qty 2

## 2021-11-16 MED ORDER — MIDAZOLAM HCL 2 MG/2ML IJ SOLN
INTRAMUSCULAR | Status: DC | PRN
  Administered 2021-11-16: 2 mg via INTRAVENOUS

## 2021-11-16 MED ORDER — SCOPOLAMINE 1 MG/3DAYS TD PT72: 1.0000 | MEDICATED_PATCH | Freq: Once | TRANSDERMAL | Status: DC

## 2021-11-16 MED ORDER — PROPOFOL 10 MG/ML IV BOLUS
INTRAVENOUS | Status: AC
  Filled 2021-11-16: qty 20

## 2021-11-16 MED ORDER — FENTANYL CITRATE (PF) 250 MCG/5ML IJ SOLN
INTRAMUSCULAR | Status: AC
  Filled 2021-11-16: qty 5

## 2021-11-16 MED ORDER — DEXAMETHASONE SODIUM PHOSPHATE 10 MG/ML IJ SOLN
INTRAMUSCULAR | Status: AC
  Filled 2021-11-16: qty 1

## 2021-11-16 MED ORDER — CHLORHEXIDINE GLUCONATE CLOTH 2 % EX PADS: 6.0000 | MEDICATED_PAD | Freq: Once | CUTANEOUS | Status: DC

## 2021-11-16 MED ORDER — LIDOCAINE HCL (PF) 1 % IJ SOLN
INTRAMUSCULAR | Status: AC
  Filled 2021-11-16: qty 30

## 2021-11-16 MED ORDER — LIDOCAINE HCL (PF) 1 % IJ SOLN
INTRAMUSCULAR | Status: DC | PRN
  Administered 2021-11-16: 30 mL

## 2021-11-16 MED ORDER — OXYCODONE HCL 5 MG PO TABS: 5.0000 mg | ORAL_TABLET | Freq: Four times a day (QID) | ORAL | 0 refills | Status: DC | PRN

## 2021-11-16 MED ORDER — LIDOCAINE 2% (20 MG/ML) 5 ML SYRINGE
INTRAMUSCULAR | Status: AC
  Filled 2021-11-16: qty 5

## 2021-11-16 MED ORDER — BUPIVACAINE-EPINEPHRINE (PF) 0.25% -1:200000 IJ SOLN
INTRAMUSCULAR | Status: AC
  Filled 2021-11-16: qty 30

## 2021-11-16 MED ORDER — CHLORHEXIDINE GLUCONATE 0.12 % MT SOLN: 15.0000 mL | Freq: Once | OROMUCOSAL | Status: AC

## 2021-11-16 MED ORDER — LIDOCAINE 2% (20 MG/ML) 5 ML SYRINGE
INTRAMUSCULAR | Status: DC | PRN
  Administered 2021-11-16: 50 mg via INTRAVENOUS

## 2021-11-16 MED ORDER — CEFAZOLIN SODIUM-DEXTROSE 2-4 GM/100ML-% IV SOLN
2.0000 g | INTRAVENOUS | Status: AC
  Administered 2021-11-16: 2 g via INTRAVENOUS

## 2021-11-16 SURGICAL SUPPLY — 29 items
BAG COUNTER SPONGE SURGICOUNT (BAG) ×2 IMPLANT
CANISTER SUCT 3000ML PPV (MISCELLANEOUS) ×1 IMPLANT
CHLORAPREP W/TINT 10.5 ML (MISCELLANEOUS) ×2 IMPLANT
CNTNR URN SCR LID CUP LEK RST (MISCELLANEOUS) ×1 IMPLANT
CONT SPEC 4OZ STRL OR WHT (MISCELLANEOUS) ×2
COVER SURGICAL LIGHT HANDLE (MISCELLANEOUS) ×2 IMPLANT
DERMABOND ADVANCED (GAUZE/BANDAGES/DRESSINGS) ×2
DERMABOND ADVANCED .7 DNX12 (GAUZE/BANDAGES/DRESSINGS) ×1 IMPLANT
DRAPE LAPAROTOMY 100X72 PEDS (DRAPES) ×2 IMPLANT
ELECT REM PT RETURN 9FT ADLT (ELECTROSURGICAL) ×2
ELECTRODE REM PT RTRN 9FT ADLT (ELECTROSURGICAL) ×1 IMPLANT
GAUZE 4X4 16PLY ~~LOC~~+RFID DBL (SPONGE) ×2 IMPLANT
GLOVE SURG ENC MOIS LTX SZ6 (GLOVE) ×2 IMPLANT
GLOVE SURG UNDER LTX SZ6.5 (GLOVE) ×2 IMPLANT
GOWN STRL REUS W/ TWL LRG LVL3 (GOWN DISPOSABLE) ×1 IMPLANT
GOWN STRL REUS W/TWL 2XL LVL3 (GOWN DISPOSABLE) ×4 IMPLANT
GOWN STRL REUS W/TWL LRG LVL3 (GOWN DISPOSABLE) ×1
KIT BASIN OR (CUSTOM PROCEDURE TRAY) ×2 IMPLANT
KIT TURNOVER KIT B (KITS) ×2 IMPLANT
NDL HYPO 25GX1X1/2 BEV (NEEDLE) ×1 IMPLANT
NEEDLE HYPO 25GX1X1/2 BEV (NEEDLE) ×2 IMPLANT
NS IRRIG 1000ML POUR BTL (IV SOLUTION) ×2 IMPLANT
PACK GENERAL/GYN (CUSTOM PROCEDURE TRAY) ×2 IMPLANT
PAD ARMBOARD 7.5X6 YLW CONV (MISCELLANEOUS) ×4 IMPLANT
SUT MON AB 4-0 PC3 18 (SUTURE) ×2 IMPLANT
SUT VIC AB 3-0 SH 18 (SUTURE) ×2 IMPLANT
SYR CONTROL 10ML LL (SYRINGE) ×2 IMPLANT
TOWEL GREEN STERILE (TOWEL DISPOSABLE) ×2 IMPLANT
TOWEL GREEN STERILE FF (TOWEL DISPOSABLE) ×2 IMPLANT

## 2021-11-16 NOTE — Anesthesia Procedure Notes (Signed)
Procedure Name: LMA Insertion Date/Time: 11/16/2021 10:32 AM Performed by: Lorie Phenix, CRNA Pre-anesthesia Checklist: Patient identified, Emergency Drugs available, Suction available and Patient being monitored Patient Re-evaluated:Patient Re-evaluated prior to induction Oxygen Delivery Method: Circle System Utilized Preoxygenation: Pre-oxygenation with 100% oxygen Induction Type: IV induction Ventilation: Mask ventilation without difficulty LMA: LMA inserted LMA Size: 4.0 Number of attempts: 1 Placement Confirmation: positive ETCO2 Tube secured with: Tape Dental Injury: Teeth and Oropharynx as per pre-operative assessment

## 2021-11-16 NOTE — Interval H&P Note (Signed)
History and Physical Interval Note:  11/16/2021 10:09 AM  Patricia Gentry  has presented today for surgery, with the diagnosis of MUSCLE WEAKNESS.  The various methods of treatment have been discussed with the patient and family. After consideration of risks, benefits and other options for treatment, the patient has consented to  Procedure(s): MUSCLE BIOPSY LEFT BICEPS AND LEFT QUADRICEPS (Left) as a surgical intervention.  The patient's history has been reviewed, patient examined, no change in status, stable for surgery.  I have reviewed the patient's chart and labs.  Questions were answered to the patient's satisfaction.     Stark Klein

## 2021-11-16 NOTE — Discharge Instructions (Addendum)
[place] Office Phone Number [phone]   POST OP INSTRUCTIONS  Always review your discharge instruction sheet given to you by the facility where your surgery was performed.  IF YOU HAVE DISABILITY OR FAMILY LEAVE FORMS, YOU MUST BRING THEM TO THE OFFICE FOR PROCESSING.  DO NOT GIVE THEM TO YOUR DOCTOR.  A prescription for pain medication may be given to you upon discharge.  Take your pain medication as prescribed, if needed.  If narcotic pain medicine is not needed, then you may take acetaminophen (Tylenol) or ibuprofen (Advil) as needed. Take your usually prescribed medications unless otherwise directed If you need a refill on your pain medication, please contact your pharmacy.  They will contact our office to request authorization.  Prescriptions will not be filled after 5pm or on week-ends. You should eat very light the first 24 hours after surgery, such as soup, crackers, pudding, etc.  Resume your normal diet the day after surgery It is common to experience some constipation if taking pain medication after surgery.  Increasing fluid intake and taking a stool softener will usually help or prevent this problem from occurring.  A mild laxative (Milk of Magnesia or Miralax) should be taken according to package directions if there are no bowel movements after 48 hours. You may shower in 48 hours.  The surgical glue will flake off in 2-3 weeks.   ACTIVITIES:  No strenuous activity or heavy lifting for 1 week.   You may drive when you no longer are taking prescription pain medication, you can comfortably wear a seatbelt, and you can safely maneuver your car and apply brakes. RETURN TO WORK:  __________1 week if applicable.  _______________ Dennis Bast should see your doctor in the office for a follow-up appointment approximately three-four weeks after your surgery.    WHEN TO CALL YOUR DOCTOR: Fever over 101.0 Nausea and/or vomiting. Extreme swelling or bruising. Continued  bleeding from incision. Increased pain, redness, or drainage from the incision.  The clinic staff is available to answer your questions during regular business hours.  Please dont hesitate to call and ask to speak to one of the nurses for clinical concerns.  If you have a medical emergency, go to the nearest emergency room or call 911.  A surgeon from [facility] is always on call at the hospital.  For further questions, please visit [email or web address]

## 2021-11-16 NOTE — Anesthesia Postprocedure Evaluation (Signed)
Anesthesia Post Note  Patient: Patricia Gentry  Procedure(s) Performed: MUSCLE BIOPSY LEFT BICEPS AND LEFT QUADRICEPS (Left)     Patient location during evaluation: PACU Anesthesia Type: General Level of consciousness: awake and alert Pain management: pain level controlled Vital Signs Assessment: post-procedure vital signs reviewed and stable Respiratory status: spontaneous breathing, nonlabored ventilation, respiratory function stable and patient connected to nasal cannula oxygen Cardiovascular status: blood pressure returned to baseline and stable Postop Assessment: no apparent nausea or vomiting Anesthetic complications: no   No notable events documented.  Last Vitals:  Vitals:   11/16/21 1224 11/16/21 1225  BP: 129/82   Pulse: 91 91  Resp: 17 18  Temp:  36.7 C  SpO2: 91% 91%    Last Pain:  Vitals:   11/16/21 1225  PainSc: 0-No pain                 Chiquita Heckert

## 2021-11-16 NOTE — Anesthesia Preprocedure Evaluation (Signed)
Anesthesia Evaluation  Patient identified by MRN, date of birth, ID band Patient awake    History of Anesthesia Complications (+) PONV, PROLONGED EMERGENCE and history of anesthetic complications  Airway Mallampati: II   Neck ROM: Full  Mouth opening: Limited Mouth Opening  Dental  (+) Teeth Intact, Dental Advisory Given   Pulmonary neg pulmonary ROS,    breath sounds clear to auscultation       Cardiovascular hypertension, Pt. on medications negative cardio ROS   Rhythm:Regular Rate:Normal     Neuro/Psych PSYCHIATRIC DISORDERS Anxiety Depression    GI/Hepatic Neg liver ROS, GERD  Medicated,  Endo/Other  negative endocrine ROS  Renal/GU negative Renal ROS   HPV    Musculoskeletal  (+) Arthritis , Osteoarthritis,    Abdominal   Peds  Hematology negative hematology ROS (+)   Anesthesia Other Findings   Reproductive/Obstetrics                             Anesthesia Physical  Anesthesia Plan  ASA: 2  Anesthesia Plan: General   Post-op Pain Management: Minimal or no pain anticipated   Induction: Intravenous  PONV Risk Score and Plan: 4 or greater and Ondansetron, Dexamethasone, Scopolamine patch - Pre-op and Treatment may vary due to age or medical condition  Airway Management Planned: Oral ETT and LMA  Additional Equipment:   Intra-op Plan:   Post-operative Plan: Extubation in OR  Informed Consent: I have reviewed the patients History and Physical, chart, labs and discussed the procedure including the risks, benefits and alternatives for the proposed anesthesia with the patient or authorized representative who has indicated his/her understanding and acceptance.     Dental advisory given  Plan Discussed with: CRNA and Anesthesiologist  Anesthesia Plan Comments:         Anesthesia Quick Evaluation

## 2021-11-16 NOTE — Transfer of Care (Signed)
Immediate Anesthesia Transfer of Care Note  Patient: Patricia Gentry  Procedure(s) Performed: MUSCLE BIOPSY LEFT BICEPS AND LEFT QUADRICEPS (Left)  Patient Location: PACU  Anesthesia Type:General  Level of Consciousness: awake and alert   Airway & Oxygen Therapy: Patient Spontanous Breathing and Patient connected to face mask oxygen  Post-op Assessment: Report given to RN and Post -op Vital signs reviewed and stable  Post vital signs: Reviewed and stable  Last Vitals:  Vitals Value Taken Time  BP 112/63 11/16/21 1153  Temp    Pulse 95 11/16/21 1154  Resp 15 11/16/21 1154  SpO2 98 % 11/16/21 1154  Vitals shown include unvalidated device data.  Last Pain:  Vitals:   11/16/21 0915  PainSc: 0-No pain         Complications: No notable events documented.

## 2021-11-16 NOTE — Op Note (Signed)
PRE-OPERATIVE DIAGNOSIS: weakness  POST-OPERATIVE DIAGNOSIS:  Same  PROCEDURE:  Procedure(s): Left quadriceps muscle biopsy, left biceps muscle biopsy  SURGEON:  Surgeon(s): Stark Klein, MD  ASSISTANT: Izola Price, RNFA  ANESTHESIA:   local and general  DRAINS: none   LOCAL MEDICATIONS USED:  MARCAINE    and LIDOCAINE   SPECIMEN:  Source of Specimen:  left biceps, left quadriceps  DISPOSITION OF SPECIMEN:  PATHOLOGY  COUNTS:  YES  DICTATION: .Dragon Dictation  PLAN OF CARE: Discharge to home after PACU  PATIENT DISPOSITION:  PACU - hemodynamically stable.  EBL minimal  Findings:  Muscle appears grossly normal  PROCEDURE:   Patient was identified in the holding area and taken the operating room where she was placed supine on the operating room table. General anesthesia was induced the LMA anesthesia. The left upper arm and left thigh were prepped and draped in sterile fashion. Timeout was performed according to the surgical safety checklist. When all was correct, we continued.   The biceps was addressed first.  A vertical incision was made approximately 4 cm in length on the anterior upper arm. Extensive local anesthetic was administered. The subcutaneous tissues were divided with the cautery. A Wietlaner retractor was used for visualization.   The muscle fascia was opened sharply with the Metzenbaum scissors. A 3 cm piece of muscle was dissected out with a tonsil. This was clamped on both ends with hemostats. The 15 blade was used to resect a piece of muscle. This was tied down to the tongue depressor to keep it in stretch. The 2 ends of the muscle were tied off with 2-0 silk ties. The wound was irrigated.   The thigh was addressed similarly.  Local was infiltrated into the skin around both incisions.  The skin was reapproximated with 3-0 Vicryl interrupted deep dermal suture and 4-0 Monocryl running subcuticular suture. The incision was then cleaned, dried, and dressed  with Dermabond and Steri-Strips. Patient was awakened from anesthesia and taken to the PACU in stable condition. Needle, sponge, instrument counts were correct x2.

## 2021-11-16 NOTE — H&P (Signed)
REFERRING PHYSICIAN: Steva Colder, MD  PROVIDER: Georgianne Fick, MD  MRN: [id number] DOB: 08/29/1956  Subjective   Chief Complaint: muscle biopsy   History of Present Illness: Patricia Gentry is a 66 y.o. female who is seen today as an office consultation at the request of Dr. Tomi Likens for evaluation of muscle biopsy  Patient is referred by Dr. Tomi Likens for a muscle biopsy. She started noting a little bit of difficulty climbing stairs around 10 years ago. She did not think much of it until the last 6 months to a year. She started having more progressive weakness and numerous falls. She has had significant work-up with neurology including EMGs. The concern at this point is for potential myopathy. She denies fevers and chills. She has not had any significant weight loss. She does not drink or use any illicit substances.  Review of Systems: A complete review of systems was obtained from the patient. I have reviewed this information and discussed as appropriate with the patient. See HPI as well for other ROS.  Review of Systems  Neurological: Positive for weakness.  All other systems reviewed and are negative.   Medical History: Past Medical History:  Diagnosis Date   Arthritis   History of cancer   Patient Active Problem List  Diagnosis   Generalized weakness   Past Surgical History:  Procedure Laterality Date   CO2 Laser Application of the Vagina 10/20/2015  Dr. Denman George   HYSTERECTOMY SUPRACERVICAL ABDOMINAL W/REMOVAL TUBES &/OR OVARIES 1994   Robotic Assisted Upper Vaginectomy 12/04/2016  Dr. Denman George    No Known Allergies  Current Outpatient Medications on File Prior to Visit  Medication Sig Dispense Refill   estradioL (ESTRACE) 0.5 MG tablet Take 0.5 mg by mouth once daily   rosuvastatin (CRESTOR) 20 MG tablet Take 20 mg by mouth at bedtime   No current facility-administered medications on file prior to visit.   Family History  Problem Relation Age of Onset   High  blood pressure (Hypertension) Father   Colon cancer Father   Stroke Brother   High blood pressure (Hypertension) Brother   Diabetes Brother    Social History   Tobacco Use  Smoking Status Never Smoker  Smokeless Tobacco Never Used    Social History   Socioeconomic History   Marital status: Unknown  Tobacco Use   Smoking status: Never Smoker   Smokeless tobacco: Never Used  Substance and Sexual Activity   Alcohol use: Never   Drug use: Never   Objective:   Vitals:  BP: 110/70  Pulse: 84  Temp: 36.8 C (98.2 F)  SpO2: 98%  Weight: 61.1 kg (134 lb 9.6 oz)  Height: 162.6 cm (5\' 4" )   Body mass index is 23.1 kg/m.  Head: Normocephalic and atraumatic.  Eyes: Conjunctivae are normal. Pupils are equal, round, and reactive to light. No scleral icterus.  Neck: Normal range of motion. Neck supple. No tracheal deviation present. No thyromegaly present.  Cardiovascular: Normal rate, regular rhythm, normal heart sounds and intact distal pulses. Exam reveals no gallop and no friction rub.  No murmur heard. Respiratory: Effort normal and breath sounds normal. No respiratory distress. No wheezes, rales or rhonchi. No chest wall tenderness.  GI: Soft. NT. There is no rebound and no guarding.  Musculoskeletal: . Extremities are non tender and without deformity.  Lymphadenopathy: No cervical or axillary adenopathy.  Neurological: Alert and oriented to person, place, and time. Left upper leg and left arm with significantly decreased muscle belly  size compared to left.  Skin: Skin is warm and dry. No rash noted. No diaphoresis. No erythema. No pallor.  Psychiatric: Normal mood and affect.Behavior is normal. Judgment and thought content normal.   Labs, Imaging and Diagnostic Testing: Sed rate 38 CK 255  Assessment and Plan:  Diagnoses and all orders for this visit:  Generalized weakness  Will plan left biceps and left quadriceps muscle biopsies as requested by neurology. I  discussed risks of bleeding, infection, pain, non diagnostic results and other wound issues. I discussed that further workup would continue to be by Dr Tomi Likens and/or other requested colleagues.  She wishes to proceed.    Georgianne Fick, MD

## 2021-11-17 ENCOUNTER — Encounter (HOSPITAL_COMMUNITY): Payer: Self-pay | Admitting: General Surgery

## 2021-11-28 ENCOUNTER — Encounter (HOSPITAL_COMMUNITY): Payer: Self-pay

## 2021-11-28 LAB — SURGICAL PATHOLOGY

## 2021-12-01 ENCOUNTER — Telehealth: Payer: Self-pay | Admitting: Neurology

## 2021-12-01 NOTE — Telephone Encounter (Signed)
I am not sure if the patient received the result note regarding her muscle biopsy results.  Results of the muscle biopsy are back.  It does show evidence of a muscle disease.  I would like her to be worked in for video visit so that we can discuss next step.

## 2021-12-01 NOTE — Telephone Encounter (Signed)
P advised of her Biopsy results. And to schedule a vv with Dr.Jaffe to discuss.

## 2021-12-05 NOTE — Progress Notes (Signed)
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  UPDATE: Labs in May revealed sed rate 38, CRP 1.7, mildly elevated CK 255, elevated aldolase 24, and TSH 2.21.  MyoMarker 3 Plus Profile (RDL) was negative.  NCV-EMG on 04/26/2021 personally reviewed demonstrated findings of diffuse irritable myopathy with preferential involvement of hip flexors and finger flexors and mixed neurogenic and myopathic findings suggestive of inclusion body myositis.  She underwent muscle biopsy of the left bicep and left quadricep which revealed evidence of inflammatory myopathy with muscle atrophy and some neurogenic atrophy.  She has had some falls.  She feels a little weaker in the hands and legs.     HISTORY: Since around 2019, she has had an increase in falls.  She states that her legs will just give out.  At first, it was thought to be secondary to statin but it persisted despite discontinuation.  She has resumed taking it.  No numbness, back pain, leg pain, double vision, dysphagia, shortness of breath, or involvement of upper extremities.  She falls about 2 to 3 times a month.  It is difficulty for her to stand up from a seat.  She needs to hold onto the bannister when using the stairs.  No family history of similar symptoms.  Her PCP noted this year that she did not exhibit any reflexes on exam.  She had an MRI of the lumbar spine without contrast on 12/30/2020, which was personally reviewed, and demonstrated multilevel degenerative disease with no significant spinal canal or neural foraminal stenosis.    PAST MEDICAL HISTORY: Past Medical History:  Diagnosis Date   Anxiety    Arthritis    Cancer ([place])    Complication of  anesthesia    slow to wake   Depression    GERD (gastroesophageal reflux disease)    High risk HPV infection    per Pap 09-02-2015   History of cervical dysplasia    CONIZATION 1980'S   History of condyloma acuminatum    vulva--  2009   History of vaginal dysplasia    VAIN 2--  LASER ABLATION 2005   Hypertension    PONV (postoperative nausea and vomiting)    VAIN III (vaginal intraepithelial neoplasia grade III)    Wears glasses     MEDICATIONS: Current Outpatient Medications on File Prior to Visit  Medication Sig Dispense Refill   amLODipine (NORVASC) 5 MG tablet Take 5 mg by mouth in the morning.     Cholecalciferol (VITAMIN D3) 2000 units TABS Take 2,000 Units by mouth daily.     escitalopram (LEXAPRO) 10 MG tablet TAKE ONE TABLET BY MOUTH DAILY 90 tablet 4   estradiol (ESTRACE) 0.5 MG tablet TAKE 1 TABLET BY MOUTH ONCE DAILY 90 tablet 3   fluticasone (FLONASE) 50 MCG/ACT nasal spray Place 2 sprays into both nostrils daily as needed for allergies.     levocetirizine (XYZAL) 5 MG tablet Take 5 mg by mouth every evening.     Multiple Vitamin (MULTIVITAMIN WITH MINERALS) TABS tablet Take 1 tablet by mouth in the morning.     oxyCODONE (OXY IR/ROXICODONE) 5 MG immediate release tablet Take 1 tablet (5 mg total) by mouth every 6 (six) hours as needed for severe pain.  5 tablet 0   rosuvastatin (CRESTOR) 20 MG tablet Take 20 mg by mouth at bedtime.     senna (SENOKOT) 8.6 MG TABS tablet Take 1 tablet (8.6 mg total) by mouth at bedtime. 120 each 0   No current facility-administered medications on file prior to visit.    ALLERGIES: No Known Allergies  FAMILY HISTORY: Family History  Problem Relation Age of Onset   Hypertension Mother    Heart disease Mother    Hypertension Father    Heart disease Father    Hypertension Brother    Heart disease Brother    Stroke Brother    Diabetes Brother        younger brother   Osteoporosis Maternal Aunt       Objective:  Blood  pressure (!) 147/79, pulse 89, height 5\' 3"  (1.6 m), weight 143 lb 9.6 oz (65.1 kg), last menstrual period 10/15/1992, SpO2 95 %. General: No acute distress.  Patient appears well-groomed.   Head:  Normocephalic/atraumatic Eyes:  Fundi examined but not visualized Neck: supple, no paraspinal tenderness, full range of motion Heart:  Regular rate and rhythm Lungs:  Clear to auscultation bilaterally Back: No paraspinal tenderness Neurological Exam: alert and oriented to person, place, and time.  Speech fluent and not dysarthric, language intact.  CN II-XII intact. Bulk and tone normal, muscle strength 5-/5 bilateral deltoid, 3+ bilateral triceps, 4+/5 bilateral interossei/finger flexion and extension, 4+/5 bilateral hand grip but otherwise 5/5 upper extremities.  3/5 bilateral knee extension, 5-/5 bilateral hip flexion and knee flexion, otherwise 5/5.  Sensation to light touch intact.  Deep tendon reflexes 2+ throughout, toes downgoing.  Finger to nose testing intact.  Gait normal, Romberg negative.   Metta Clines, DO  CC: Dian Situ, MD

## 2021-12-06 ENCOUNTER — Other Ambulatory Visit: Payer: Self-pay

## 2021-12-06 ENCOUNTER — Other Ambulatory Visit: Payer: BC Managed Care – PPO

## 2021-12-06 ENCOUNTER — Ambulatory Visit (INDEPENDENT_AMBULATORY_CARE_PROVIDER_SITE_OTHER): Payer: BC Managed Care – PPO | Admitting: Neurology

## 2021-12-06 ENCOUNTER — Encounter: Payer: Self-pay | Admitting: Neurology

## 2021-12-06 VITALS — BP 147/79 | HR 89 | Ht 63.0 in | Wt 143.6 lb

## 2021-12-06 DIAGNOSIS — G7249 Other inflammatory and immune myopathies, not elsewhere classified: Secondary | ICD-10-CM | POA: Diagnosis not present

## 2021-12-06 NOTE — Patient Instructions (Signed)
Will check [facility] Myopathy Panel When results available, will have you follow up

## 2021-12-27 ENCOUNTER — Telehealth: Payer: Self-pay | Admitting: Neurology

## 2021-12-27 NOTE — Telephone Encounter (Signed)
The myositis panel results are negative.  However, you can still have negative markers and have inclusion body myositis, which the biopsy supports.  As we discussed, there is no specific treatment for this muscle disease, just supportive care.  If she would like a second opinion, we can refer her to either [facility] or [place]. ?

## 2021-12-29 NOTE — Telephone Encounter (Signed)
Patient advised.

## 2022-01-01 ENCOUNTER — Telehealth: Payer: Self-pay | Admitting: Neurology

## 2022-01-01 DIAGNOSIS — G7241 Inclusion body myositis [IBM]: Secondary | ICD-10-CM

## 2022-01-01 NOTE — Telephone Encounter (Signed)
Patient called and stated she had a few more questions regarding the testing she had done. ?

## 2022-02-12 ENCOUNTER — Telehealth: Payer: Self-pay | Admitting: Neurology

## 2022-02-12 NOTE — Telephone Encounter (Signed)
[place] Myopathy panel ? ? ?Done outside lab. Paper copy sent to patient via Mychart message.  ?

## 2022-02-12 NOTE — Telephone Encounter (Signed)
Patient called and was asking about her last bloodwork results.  She stated that the Dr had let her know the results, but thought that they usually show in my chart.  She stated she does not see them in my chart. ?

## 2022-02-15 ENCOUNTER — Telehealth: Payer: Self-pay | Admitting: Neurology

## 2022-02-15 NOTE — Telephone Encounter (Signed)
Telephone call to patient, Patient wanted to know what her DX it is.  ? ?I sit the Inclusion Body Myositis?  ? ?Please advise. ?

## 2022-02-15 NOTE — Telephone Encounter (Signed)
Patient called regarding her blood work. It is saying negative but she thinks its wrong. She needs to speak with jaffe ?

## 2022-02-15 NOTE — Telephone Encounter (Signed)
Patient advised, ?My diagnosis is inclusion body myositis.  The myositis panel was negative but that doesn't necessarily rule it out (if it was positive, it would just rule in and support the diagnosis).  ?

## 2022-02-28 ENCOUNTER — Telehealth: Payer: Self-pay | Admitting: Neurology

## 2022-02-28 NOTE — Telephone Encounter (Signed)
Pt called in stating our office referred her to [facility], but [facility] doesn't have any of her records. They only have her name and address.  ?

## 2022-02-28 NOTE — Telephone Encounter (Signed)
Pt would like a call letting her know when it has been sent. ?

## 2022-02-28 NOTE — Telephone Encounter (Signed)
Records faxed via epic ?

## 2022-03-13 ENCOUNTER — Other Ambulatory Visit: Payer: Self-pay | Admitting: Obstetrics & Gynecology

## 2022-04-10 NOTE — Progress Notes (Deleted)
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   UPDATE: [place] Myopathy panel from February was negative.  We have referred her to [facility] for second opinion.     HISTORY: Since around 2019, she has had an increase in falls.  She states that her legs will just give out.  At first, it was thought to be secondary to statin but it persisted despite discontinuation.  She has resumed taking it.  No numbness, back pain, leg pain, double vision, dysphagia, shortness of breath, or involvement of upper extremities.  She falls about 2 to 3 times a month.  It is difficulty for her to stand up from a seat.  She needs to hold onto the bannister when using the stairs.  No family history of similar symptoms.  Her PCP noted this year that she did not exhibit any reflexes on exam.  She had an MRI of the lumbar spine without contrast on 12/30/2020, which was personally reviewed, and demonstrated multilevel degenerative disease with no significant spinal canal or neural foraminal stenosis.  Labs in May 2022 revealed sed rate 38, CRP 1.7, mildly elevated CK 255, elevated aldolase 24, and TSH 2.21.  MyoMarker 3 Plus Profile (RDL) was negative.  NCV-EMG on 04/26/2021 personally reviewed demonstrated findings of diffuse irritable myopathy with preferential involvement of hip flexors and finger flexors and mixed neurogenic and myopathic findings suggestive of inclusion body myositis.  She underwent muscle biopsy of the left bicep and left quadricep which revealed evidence of inflammatory myopathy with muscle atrophy and some neurogenic atrophy.  PAST MEDICAL HISTORY: Past Medical History:  Diagnosis Date   Anxiety    Arthritis    Cancer ([place])    Complication of anesthesia    slow to wake   Depression    GERD  (gastroesophageal reflux disease)    High risk HPV infection    per Pap 09-02-2015   History of cervical dysplasia    CONIZATION 1980'S   History of condyloma acuminatum    vulva--  2009   History of vaginal dysplasia    VAIN 2--  LASER ABLATION 2005   Hypertension    PONV (postoperative nausea and vomiting)    VAIN III (vaginal intraepithelial neoplasia grade III)    Wears glasses     MEDICATIONS: Current Outpatient Medications on File Prior to Visit  Medication Sig Dispense Refill   amLODipine (NORVASC) 5 MG tablet Take 5 mg by mouth in the morning.     Cholecalciferol (VITAMIN D3) 2000 units TABS Take 2,000 Units by mouth daily.     escitalopram (LEXAPRO) 10 MG tablet TAKE ONE TABLET BY MOUTH DAILY 90 tablet 2   estradiol (ESTRACE) 0.5 MG tablet TAKE 1 TABLET BY MOUTH ONCE DAILY 90 tablet 3   fluticasone (FLONASE) 50 MCG/ACT nasal spray Place 2 sprays into both nostrils daily as needed for allergies.     levocetirizine (XYZAL) 5 MG tablet Take 5 mg by mouth every evening.     Multiple Vitamin (MULTIVITAMIN WITH MINERALS) TABS tablet Take 1 tablet by mouth in the morning.     rosuvastatin (CRESTOR) 20 MG tablet Take 20 mg by mouth at bedtime.     senna (SENOKOT) 8.6 MG TABS tablet Take 1 tablet (8.6 mg total) by mouth at bedtime.  120 each 0   No current facility-administered medications on file prior to visit.    ALLERGIES: No Known Allergies  FAMILY HISTORY: Family History  Problem Relation Age of Onset   Hypertension Mother    Heart disease Mother    Hypertension Father    Heart disease Father    Hypertension Brother    Heart disease Brother    Stroke Brother    Diabetes Brother        younger brother   Osteoporosis Maternal Aunt       Objective:  *** General: No acute distress.  Patient appears ***-groomed.   Head:  Normocephalic/atraumatic Eyes:  Fundi examined but not visualized Neck: supple, no paraspinal tenderness, full range of motion Heart:  Regular  rate and rhythm Lungs:  Clear to auscultation bilaterally Back: No paraspinal tenderness Neurological Exam: alert and oriented to person, place, and time.  Speech fluent and not dysarthric, language intact.  CN II-XII intact. Bulk and tone normal, muscle strength 5/5 throughout.  Sensation to light touch intact.  Deep tendon reflexes 2+ throughout, toes downgoing.  Finger to nose testing intact.  Gait normal, Romberg negative.   Metta Clines, DO  CC: Dian Situ, MD

## 2022-04-11 ENCOUNTER — Ambulatory Visit: Payer: BC Managed Care – PPO | Admitting: Neurology

## 2022-04-18 ENCOUNTER — Other Ambulatory Visit: Payer: Self-pay

## 2022-04-18 NOTE — Telephone Encounter (Signed)
FYI. Last AEX 09/02/15 w/ Dr. Sabra Heck.  Looks like she is continuing care with her at [facility], has appt scheduled for 11/08/22. Will give pharmacy their fax info.

## 2022-05-01 ENCOUNTER — Other Ambulatory Visit (HOSPITAL_BASED_OUTPATIENT_CLINIC_OR_DEPARTMENT_OTHER): Payer: Self-pay | Admitting: Obstetrics & Gynecology

## 2022-05-01 ENCOUNTER — Telehealth (HOSPITAL_BASED_OUTPATIENT_CLINIC_OR_DEPARTMENT_OTHER): Payer: Self-pay

## 2022-05-01 MED ORDER — ESTRADIOL 0.5 MG PO TABS
0.5000 mg | ORAL_TABLET | Freq: Every day | ORAL | 3 refills | Status: DC
Start: 2022-05-01 — End: 2022-11-08

## 2022-05-01 NOTE — Telephone Encounter (Signed)
Patient called today wanting to get a refill on the estradiol 0.'5mg'$ . Patient has not been seen by you in a few years. She has been seen by gyn/oncology. She states that you had been refilling that medication for her. Please advise. tbw

## 2022-07-13 DIAGNOSIS — G7241 Inclusion body myositis [IBM]: Secondary | ICD-10-CM | POA: Insufficient documentation

## 2022-07-31 ENCOUNTER — Other Ambulatory Visit: Payer: Self-pay | Admitting: Family Medicine

## 2022-07-31 DIAGNOSIS — Z1231 Encounter for screening mammogram for malignant neoplasm of breast: Secondary | ICD-10-CM

## 2022-08-13 ENCOUNTER — Telehealth: Payer: Self-pay | Admitting: Neurology

## 2022-08-13 NOTE — Telephone Encounter (Signed)
Patient called and is requesting muscle biopsy results summary (not available in MyChart, per pt) to be sent to her doctor [facility], Dr. Steva Colder for her visit tomorrow.  She also said she was supposed to be referred to another doctor after that test but they never received her referral and she's not followed up.  Phone: [phone] for Dr. Mardi Mainland  Please call patient back about this.

## 2022-08-13 NOTE — Telephone Encounter (Signed)
Report from [place] and [place] sent to PCP via epic

## 2022-09-13 ENCOUNTER — Ambulatory Visit
Admission: RE | Admit: 2022-09-13 | Discharge: 2022-09-13 | Disposition: A | Discharge status: Home / Self Care | Payer: Medicare Other | Source: Ambulatory Visit | Attending: Family Medicine | Admitting: Family Medicine

## 2022-09-13 DIAGNOSIS — Z1231 Encounter for screening mammogram for malignant neoplasm of breast: Secondary | ICD-10-CM

## 2022-11-08 ENCOUNTER — Ambulatory Visit (HOSPITAL_BASED_OUTPATIENT_CLINIC_OR_DEPARTMENT_OTHER): Payer: Medicare Other | Admitting: Obstetrics & Gynecology

## 2022-11-08 ENCOUNTER — Other Ambulatory Visit (HOSPITAL_COMMUNITY)
Admission: RE | Admit: 2022-11-08 | Discharge: 2022-11-08 | Disposition: A | Discharge status: Home / Self Care | Payer: Medicare Other | Source: Ambulatory Visit | Attending: Obstetrics & Gynecology | Admitting: Obstetrics & Gynecology

## 2022-11-08 ENCOUNTER — Encounter (HOSPITAL_BASED_OUTPATIENT_CLINIC_OR_DEPARTMENT_OTHER): Payer: Self-pay | Admitting: Obstetrics & Gynecology

## 2022-11-08 VITALS — BP 134/76 | HR 71 | Ht 64.0 in | Wt 140.6 lb

## 2022-11-08 DIAGNOSIS — Z8659 Personal history of other mental and behavioral disorders: Secondary | ICD-10-CM

## 2022-11-08 DIAGNOSIS — Z01419 Encounter for gynecological examination (general) (routine) without abnormal findings: Secondary | ICD-10-CM | POA: Diagnosis not present

## 2022-11-08 DIAGNOSIS — Z1151 Encounter for screening for human papillomavirus (HPV): Secondary | ICD-10-CM | POA: Insufficient documentation

## 2022-11-08 DIAGNOSIS — G7241 Inclusion body myositis [IBM]: Secondary | ICD-10-CM | POA: Diagnosis not present

## 2022-11-08 DIAGNOSIS — Z79890 Hormone replacement therapy (postmenopausal): Secondary | ICD-10-CM

## 2022-11-08 DIAGNOSIS — C52 Malignant neoplasm of vagina: Secondary | ICD-10-CM | POA: Diagnosis present

## 2022-11-08 MED ORDER — ESCITALOPRAM OXALATE 10 MG PO TABS: 10.0000 mg | ORAL_TABLET | Freq: Every day | ORAL | 3 refills | Status: DC

## 2022-11-08 MED ORDER — ESTRADIOL 0.5 MG PO TABS: 0.5000 mg | ORAL_TABLET | Freq: Every day | ORAL | 3 refills | Status: DC

## 2022-11-08 NOTE — Progress Notes (Addendum)
67 y.o. G0P0 Divorced White or Caucasian female here for new patient exam.  H/o vaginal cancer.  Treated by Dr. Denman George, gyn/onc.  Denies vaginal bleeding.    On estradiol.  Doing well.  Needs rx.  Also need lexapro refill.  Recent diagnosis of inclusion myositis.  Has seen a neurologist.  Was advised there wasn't really much to do so not worth referral to [facility] or [facility].  We discussed reasons to be seen at [facility]--team approach for care, possible clinical trials, access to PT/OT referrals.  Pt ok with recommendations.  Will reach out to neurology for help with referral.  Patient's last menstrual period was 10/15/1982.          Sexually active: No.  H/O STD:  h/o HR HPV  Health Maintenance: PCP:  Dr. Mardi Mainland, in [place].  Last wellness appt was in the early winter.  Did blood work at that appt:  yes Vaccines are up to date:  yes Colonoscopy:  12/18/2012 MMG:  09/13/2022 Negaitve BMD:  08/20/2022, with osteopenia Last pap smear:  10/31/2021 Negative.   H/o abnormal pap smear:      reports that she has never smoked. She has never used smokeless tobacco. She reports that she does not drink alcohol and does not use drugs.  Past Medical History:  Diagnosis Date   Anxiety    Arthritis    Cancer ([place])    Complication of anesthesia    slow to wake   Depression    GERD (gastroesophageal reflux disease)    High risk HPV infection    per Pap 09-02-2015   History of cervical dysplasia    CONIZATION 1980'S   History of condyloma acuminatum    vulva--  2009   History of vaginal dysplasia    VAIN 2--  LASER ABLATION 2005   Hypertension    PONV (postoperative nausea and vomiting)    VAIN III (vaginal intraepithelial neoplasia grade III)    Wears glasses     Past Surgical History:  Procedure Laterality Date   ABDOMINAL HYSTERECTOMY     BREAST BIOPSY     needle bx only   CERVICAL CONE BIOPSY  mid  6269'S   CO2 LASER APPLICATION N/A 85/46/2703   Procedure: CO2 LASER  APPLICATION OF THE VAGINA;  Surgeon: Everitt Amber, MD;  Location: [facility];  Service: Gynecology;  Laterality: N/A;   EYE SURGERY     left eye cataract   LAPAROSCOPIC ASSISTED VAGINAL HYSTERECTOMY  1984   and BSO   LASER ABLATION VAGINAL DYSPLASIA  2005   high-grade    MUSCLE BIOPSY Left 11/16/2021   Procedure: MUSCLE BIOPSY LEFT BICEPS AND LEFT QUADRICEPS;  Surgeon: Stark Klein, MD;  Location: [facility];  Service: General;  Laterality: Left;   ROBOTIC ASSISTED TOTAL HYSTERECTOMY N/A 12/04/2016   Procedure: ROBOTIC ASSISTED UPPER VAGINECTOMY;  Surgeon: Everitt Amber, MD;  Location: [facility];  Service: Gynecology;  Laterality: N/A;   UPPER GI ENDOSCOPY  04/2018    Current Outpatient Medications  Medication Sig Dispense Refill   amLODipine (NORVASC) 5 MG tablet Take 5 mg by mouth in the morning.     Cholecalciferol (VITAMIN D3) 2000 units TABS Take 2,000 Units by mouth daily.     escitalopram (LEXAPRO) 10 MG tablet TAKE ONE TABLET BY MOUTH DAILY 90 tablet 2   estradiol (ESTRACE) 0.5 MG tablet Take 1 tablet (0.5 mg total) by mouth daily. 90 tablet 3   fluticasone (FLONASE) 50 MCG/ACT nasal spray  Place 2 sprays into both nostrils daily as needed for allergies.     levocetirizine (XYZAL) 5 MG tablet Take 5 mg by mouth every evening.     Multiple Vitamin (MULTIVITAMIN WITH MINERALS) TABS tablet Take 1 tablet by mouth in the morning.     rosuvastatin (CRESTOR) 20 MG tablet Take 20 mg by mouth at bedtime.     senna (SENOKOT) 8.6 MG TABS tablet Take 1 tablet (8.6 mg total) by mouth at bedtime. 120 each 0   No current facility-administered medications for this visit.    Family History  Problem Relation Age of Onset   Hypertension Mother    Heart disease Mother    Hypertension Father    Heart disease Father    Hypertension Brother    Heart disease Brother    Stroke Brother    Diabetes Brother        younger brother   Osteoporosis Maternal Aunt     Review of Systems   Constitutional: Negative.   Genitourinary: Negative.     Exam:   BP 134/76 (BP Location: Left Arm, Patient Position: Sitting, Cuff Size: Normal)   Pulse 71   Ht '5\' 4"'$  (1.626 m) Comment: Reported  Wt 140 lb 9.6 oz (63.8 kg)   LMP 10/15/1982   BMI 24.13 kg/m   Height: '5\' 4"'$  (162.6 cm) (Reported)  General appearance: alert, cooperative and appears stated age Breasts: normal appearance, no masses or tenderness Abdomen: soft, non-tender; bowel sounds normal; no masses,  no organomegaly Lymph nodes: Cervical, supraclavicular, and axillary nodes normal.  No abnormal inguinal nodes palpated Neurologic: Grossly normal  Pelvic: External genitalia:  no lesions              Urethra:  normal appearing urethra with no masses, tenderness or lesions              Bartholins and Skenes: normal                 Vagina: normal appearing vagina with atrophic changes and no discharge, no lesions              Cervix: absent              Pap taken: Yes.   Bimanual Exam:  Uterus:  uterus absent              Adnexa: no mass, fullness, tenderness               Rectovaginal: Confirms               Anus:  normal sphincter tone, no lesions  Chaperone, Octaviano Batty, CMA, was present for exam.  Assessment/Plan: 1. Vaginal cancer ([place]) - guidelines reviewed with pt.  Yearly pap smears recommended. - PR OBTAINING SCREEN PAP SMEAR - Cytology - PAP( [facility])  2. History of depression - escitalopram (LEXAPRO) 10 MG tablet; Take 1 tablet (10 mg total) by mouth daily.  Dispense: 90 tablet; Refill: 3  3. Hormone replacement therapy - estradiol (ESTRACE) 0.5 MG tablet; Take 1 tablet (0.5 mg total) by mouth daily.  Dispense: 90 tablet; Refill: 3  4.  Inclusion body myositis - will see if can help with referral for pt

## 2022-11-11 ENCOUNTER — Other Ambulatory Visit (HOSPITAL_BASED_OUTPATIENT_CLINIC_OR_DEPARTMENT_OTHER): Payer: Self-pay | Admitting: Obstetrics & Gynecology

## 2022-11-11 DIAGNOSIS — G7241 Inclusion body myositis [IBM]: Secondary | ICD-10-CM

## 2022-11-11 NOTE — Addendum Note (Signed)
Addended by: [name] on: 11/11/2022 07:21 AM   Modules accepted: Level of Service

## 2022-11-13 LAB — CYTOLOGY - PAP
Comment: NEGATIVE
Diagnosis: NEGATIVE
Diagnosis: REACTIVE
High risk HPV: NEGATIVE

## 2022-11-14 ENCOUNTER — Telehealth (HOSPITAL_BASED_OUTPATIENT_CLINIC_OR_DEPARTMENT_OTHER): Payer: Self-pay | Admitting: *Deleted

## 2022-11-14 NOTE — Telephone Encounter (Signed)
Called pt to let her know that referral to [facility] had been placed and faxed. Advised pt to let us know if she has not heard from their office for scheduling within a week. Pt verbalized understanding.

## 2023-11-28 ENCOUNTER — Ambulatory Visit (HOSPITAL_BASED_OUTPATIENT_CLINIC_OR_DEPARTMENT_OTHER): Payer: Medicare Other | Admitting: Obstetrics & Gynecology

## 2023-11-29 ENCOUNTER — Other Ambulatory Visit (HOSPITAL_BASED_OUTPATIENT_CLINIC_OR_DEPARTMENT_OTHER): Payer: Self-pay

## 2023-11-29 DIAGNOSIS — Z79890 Hormone replacement therapy (postmenopausal): Secondary | ICD-10-CM

## 2023-11-29 MED ORDER — ESTRADIOL 0.5 MG PO TABS: 0.5000 mg | ORAL_TABLET | Freq: Every day | ORAL | 0 refills | Status: DC

## 2023-12-23 ENCOUNTER — Other Ambulatory Visit (HOSPITAL_BASED_OUTPATIENT_CLINIC_OR_DEPARTMENT_OTHER): Payer: Self-pay | Admitting: *Deleted

## 2023-12-23 DIAGNOSIS — Z8659 Personal history of other mental and behavioral disorders: Secondary | ICD-10-CM

## 2023-12-23 MED ORDER — ESCITALOPRAM OXALATE 10 MG PO TABS: 10.0000 mg | ORAL_TABLET | Freq: Every day | ORAL | 0 refills | Status: DC

## 2023-12-23 NOTE — Progress Notes (Signed)
 Pt requests refill on lexapro. Has appt with provider in April. Rx sent to pharmacy.

## 2024-01-06 ENCOUNTER — Other Ambulatory Visit: Payer: Self-pay | Admitting: Family Medicine

## 2024-01-06 DIAGNOSIS — Z1231 Encounter for screening mammogram for malignant neoplasm of breast: Secondary | ICD-10-CM

## 2024-01-14 ENCOUNTER — Ambulatory Visit (HOSPITAL_BASED_OUTPATIENT_CLINIC_OR_DEPARTMENT_OTHER): Payer: Medicare Other | Admitting: Obstetrics & Gynecology

## 2024-01-14 ENCOUNTER — Encounter (HOSPITAL_BASED_OUTPATIENT_CLINIC_OR_DEPARTMENT_OTHER): Payer: Self-pay | Admitting: Obstetrics & Gynecology

## 2024-01-14 ENCOUNTER — Other Ambulatory Visit (HOSPITAL_COMMUNITY)
Admission: RE | Admit: 2024-01-14 | Discharge: 2024-01-14 | Disposition: A | Discharge status: Home / Self Care | Source: Ambulatory Visit | Attending: Obstetrics & Gynecology | Admitting: Obstetrics & Gynecology

## 2024-01-14 VITALS — BP 132/73 | HR 70 | Ht 64.0 in | Wt 138.2 lb

## 2024-01-14 DIAGNOSIS — Z01411 Encounter for gynecological examination (general) (routine) with abnormal findings: Secondary | ICD-10-CM | POA: Insufficient documentation

## 2024-01-14 DIAGNOSIS — D072 Carcinoma in situ of vagina: Secondary | ICD-10-CM

## 2024-01-14 DIAGNOSIS — Z1211 Encounter for screening for malignant neoplasm of colon: Secondary | ICD-10-CM | POA: Diagnosis not present

## 2024-01-14 DIAGNOSIS — C52 Malignant neoplasm of vagina: Secondary | ICD-10-CM | POA: Insufficient documentation

## 2024-01-14 DIAGNOSIS — Z9189 Other specified personal risk factors, not elsewhere classified: Secondary | ICD-10-CM

## 2024-01-14 DIAGNOSIS — Z79890 Hormone replacement therapy (postmenopausal): Secondary | ICD-10-CM

## 2024-01-14 DIAGNOSIS — Z1151 Encounter for screening for human papillomavirus (HPV): Secondary | ICD-10-CM | POA: Diagnosis not present

## 2024-01-14 DIAGNOSIS — Z8659 Personal history of other mental and behavioral disorders: Secondary | ICD-10-CM

## 2024-01-14 DIAGNOSIS — G7241 Inclusion body myositis [IBM]: Secondary | ICD-10-CM

## 2024-01-14 MED ORDER — ESTRADIOL 0.5 MG PO TABS: 0.5000 mg | ORAL_TABLET | Freq: Every day | ORAL | 3 refills | Status: AC

## 2024-01-14 MED ORDER — ESCITALOPRAM OXALATE 10 MG PO TABS: 10.0000 mg | ORAL_TABLET | Freq: Every day | ORAL | 3 refills | Status: AC

## 2024-01-14 NOTE — Progress Notes (Signed)
 Breast and Pelvic Exam Patient name: Patricia Gentry MRN [id number]  Date of birth: [birth year] Chief Complaint:   Breast and Pelvic Exam  History of Present Illness:   Patricia Gentry is a 68 y.o. G0P0 Caucasian female being seen today for breast and pelvic exam.  Denies vaginal bleeding.  Denies urinary issues.  Bowel function is normal.    Followed at [facility] by neurology and pulmonology.  Has started needing non-invasive ventilation at night.  Followed every 6 months.     Patient's last menstrual period was 10/15/1982.  Last pap 11/08/2022. Results were: NILM w/ HRHPV negative. H/O abnormal pap: h/o vaginal cancer Last mammogram: 09/13/2022. Results were: normal. Family h/o breast cancer: no Last colonoscopy: 12/18/12. Results were: normal. Family h/o colorectal cancer: no DEXA:  last done with PCP     01/14/2024    1:11 PM 11/08/2022   10:35 AM  Depression screen PHQ 2/9  Decreased Interest 0 0  Down, Depressed, Hopeless 0 0  PHQ - 2 Score 0 0    Review of Systems:   Pertinent items are noted in HPI  Denies any vaginal bleeding, vaginal discharge, urinary or bowel changes.   Pertinent History Reviewed:  Reviewed past medical,surgical, social and family history.  Reviewed problem list, medications and allergies. Physical Assessment:   Vitals:   01/14/24 1310  BP: 132/73  Pulse: 70  Weight: 138 lb 3.2 oz (62.7 kg)  Height: 5\' 4"  (1.626 m)  Body mass index is 23.72 kg/m.        Physical Examination:   General appearance - well appearing, and in no distress  Mental status - alert, oriented to person, place, and time  Psych:  She has a normal mood and affect  Skin - warm and dry, normal color, no suspicious lesions noted  Chest - effort normal, all lung fields clear to auscultation bilaterally  Heart - normal rate and regular rhythm  Neck:  midline trachea, no thyromegaly or nodules  Breasts - breasts appear normal, no suspicious masses, no skin or nipple changes or   axillary nodes  Abdomen - soft, nontender, nondistended, no masses or organomegaly  Pelvic - VULVA: normal appearing vulva with no masses, tenderness or lesions    VAGINA: normal appearing vagina with normal color and discharge, no lesions    CERVIX: surgically absent  Thin prep pap is done with HR HPV cotesting  UTERUS: surgically absent  Rectal - normal rectal, good sphincter tone, no masses felt  Extremities:  No swelling or varicosities noted  Chaperone present for exam  Assessment & Plan:  1. GYN exam for high-risk Medicare patient ([place]) - Pap smear and HR HPV obtained today - Mammogram scheduled for 01/2024 - Colonoscopy is overdue.  Pt declines.  Cologuard ordered. - Bone mineral density done with PCP.  I do not have copy of this. - lab work done with PCP - vaccines reviewed/updated  2. Vaginal cancer ([facility]) - Cytology - PAP( [place])  3. Postmenopausal HRT (hormone replacement therapy) - estradiol (ESTRACE) 0.5 MG tablet; Take 1 tablet (0.5 mg total) by mouth daily.  Dispense: 90 tablet; Refill: 3  4. VAIN III (vaginal intraepithelial neoplasia III) - h/o VAIN  5. Colon cancer screening - Cologuard  6. Hormone replacement therapy - escitalopram (LEXAPRO) 10 MG tablet; Take 1 tablet (10 mg total) by mouth daily.  Dispense: 90 tablet; Refill: 3  7. Inclusion body myositis   Orders Placed This Encounter  Procedures   Cologuard  Meds:  Meds ordered this encounter  Medications   estradiol (ESTRACE) 0.5 MG tablet    Sig: Take 1 tablet (0.5 mg total) by mouth daily.    Dispense:  90 tablet    Refill:  3    Patient will receive additional refills at next office visit   escitalopram (LEXAPRO) 10 MG tablet    Sig: Take 1 tablet (10 mg total) by mouth daily.    Dispense:  90 tablet    Refill:  3    Follow-up: Return in about 1 year (around 01/13/2025).  Jerene Bears, MD 01/14/2024 1:41 PM

## 2024-01-16 LAB — CYTOLOGY - PAP
Adequacy: ABSENT
Comment: NEGATIVE
Diagnosis: NEGATIVE
High risk HPV: NEGATIVE

## 2024-01-18 ENCOUNTER — Encounter (HOSPITAL_BASED_OUTPATIENT_CLINIC_OR_DEPARTMENT_OTHER): Payer: Self-pay | Admitting: Obstetrics & Gynecology

## 2024-01-20 ENCOUNTER — Ambulatory Visit
Admission: RE | Admit: 2024-01-20 | Discharge: 2024-01-20 | Disposition: A | Discharge status: Home / Self Care | Source: Ambulatory Visit | Attending: Family Medicine | Admitting: Family Medicine

## 2024-01-20 DIAGNOSIS — Z1231 Encounter for screening mammogram for malignant neoplasm of breast: Secondary | ICD-10-CM

## 2024-01-29 LAB — COLOGUARD: COLOGUARD: NEGATIVE

## 2025-01-15 ENCOUNTER — Ambulatory Visit (HOSPITAL_BASED_OUTPATIENT_CLINIC_OR_DEPARTMENT_OTHER): Admitting: Obstetrics & Gynecology
